# Patient Record
Sex: Female | Born: 1955 | Race: White | Hispanic: No | Marital: Married | Smoking: Never smoker
Health system: Southern US, Community
[De-identification: ages and names within clinical notes are randomized; demographics above are authoritative.]

## PROBLEM LIST (undated history)

## (undated) DIAGNOSIS — I10 Essential (primary) hypertension: Secondary | ICD-10-CM

## (undated) DIAGNOSIS — G4733 Obstructive sleep apnea (adult) (pediatric): Secondary | ICD-10-CM

## (undated) DIAGNOSIS — E7800 Pure hypercholesterolemia, unspecified: Secondary | ICD-10-CM

## (undated) DIAGNOSIS — D496 Neoplasm of unspecified behavior of brain: Secondary | ICD-10-CM

## (undated) DIAGNOSIS — G8929 Other chronic pain: Secondary | ICD-10-CM

## (undated) DIAGNOSIS — R51 Headache: Secondary | ICD-10-CM

## (undated) DIAGNOSIS — K5900 Constipation, unspecified: Secondary | ICD-10-CM

## (undated) DIAGNOSIS — C539 Malignant neoplasm of cervix uteri, unspecified: Secondary | ICD-10-CM

## (undated) DIAGNOSIS — J45909 Unspecified asthma, uncomplicated: Secondary | ICD-10-CM

## (undated) HISTORY — DX: Other chronic pain: G89.29

## (undated) HISTORY — PX: CHOLECYSTECTOMY: SHX55

## (undated) HISTORY — PX: VAGINAL HYSTERECTOMY: SUR661

## (undated) HISTORY — DX: Malignant neoplasm of cervix uteri, unspecified: C53.9

## (undated) HISTORY — PX: TUBAL LIGATION: SHX77

## (undated) HISTORY — DX: Obstructive sleep apnea (adult) (pediatric): G47.33

## (undated) HISTORY — DX: Unspecified asthma, uncomplicated: J45.909

## (undated) HISTORY — DX: Headache: R51

## (undated) HISTORY — PX: HEMORRHOID SURGERY: SHX153

## (undated) HISTORY — DX: Pure hypercholesterolemia, unspecified: E78.00

## (undated) HISTORY — DX: Essential (primary) hypertension: I10

## (undated) HISTORY — PX: OTHER SURGICAL HISTORY: SHX169

---

## 1998-07-11 ENCOUNTER — Ambulatory Visit: Admission: RE | Admit: 1998-07-11 | Discharge: 1998-07-11 | Payer: Self-pay | Admitting: Gynecology

## 1998-07-18 ENCOUNTER — Ambulatory Visit (HOSPITAL_COMMUNITY): Admission: RE | Admit: 1998-07-18 | Discharge: 1998-07-18 | Payer: Self-pay | Admitting: Gynecology

## 1998-08-01 ENCOUNTER — Ambulatory Visit: Admission: RE | Admit: 1998-08-01 | Discharge: 1998-08-01 | Payer: Self-pay | Admitting: Gynecology

## 1998-08-07 ENCOUNTER — Encounter: Payer: Self-pay | Admitting: Gynecology

## 1998-08-07 ENCOUNTER — Ambulatory Visit (HOSPITAL_COMMUNITY): Admission: RE | Admit: 1998-08-07 | Discharge: 1998-08-07 | Payer: Self-pay | Admitting: Gynecology

## 1998-08-14 ENCOUNTER — Ambulatory Visit: Admission: RE | Admit: 1998-08-14 | Discharge: 1998-08-14 | Payer: Self-pay | Admitting: Gynecology

## 1998-08-21 ENCOUNTER — Inpatient Hospital Stay (HOSPITAL_COMMUNITY): Admission: RE | Admit: 1998-08-21 | Discharge: 1998-08-24 | Payer: Self-pay | Admitting: Obstetrics & Gynecology

## 1998-09-18 ENCOUNTER — Ambulatory Visit: Admission: RE | Admit: 1998-09-18 | Discharge: 1998-09-18 | Payer: Self-pay | Admitting: Gynecologic Oncology

## 1999-04-15 ENCOUNTER — Other Ambulatory Visit: Admission: RE | Admit: 1999-04-15 | Discharge: 1999-04-15 | Payer: Self-pay | Admitting: *Deleted

## 1999-08-14 ENCOUNTER — Other Ambulatory Visit: Admission: RE | Admit: 1999-08-14 | Discharge: 1999-08-14 | Payer: Self-pay | Admitting: *Deleted

## 2000-01-04 ENCOUNTER — Emergency Department (HOSPITAL_COMMUNITY): Admission: EM | Admit: 2000-01-04 | Discharge: 2000-01-04 | Payer: Self-pay

## 2000-02-20 ENCOUNTER — Other Ambulatory Visit: Admission: RE | Admit: 2000-02-20 | Discharge: 2000-02-20 | Payer: Self-pay | Admitting: *Deleted

## 2000-04-20 ENCOUNTER — Emergency Department (HOSPITAL_COMMUNITY): Admission: EM | Admit: 2000-04-20 | Discharge: 2000-04-20 | Payer: Self-pay

## 2001-05-06 ENCOUNTER — Other Ambulatory Visit: Admission: RE | Admit: 2001-05-06 | Discharge: 2001-05-06 | Payer: Self-pay | Admitting: *Deleted

## 2001-08-09 ENCOUNTER — Encounter: Payer: Self-pay | Admitting: *Deleted

## 2001-08-11 ENCOUNTER — Encounter (INDEPENDENT_AMBULATORY_CARE_PROVIDER_SITE_OTHER): Payer: Self-pay | Admitting: Specialist

## 2001-08-12 ENCOUNTER — Inpatient Hospital Stay (HOSPITAL_COMMUNITY): Admission: EM | Admit: 2001-08-12 | Discharge: 2001-08-13 | Payer: Self-pay | Admitting: *Deleted

## 2002-04-21 ENCOUNTER — Other Ambulatory Visit: Admission: RE | Admit: 2002-04-21 | Discharge: 2002-04-21 | Payer: Self-pay | Admitting: *Deleted

## 2003-05-03 ENCOUNTER — Other Ambulatory Visit: Admission: RE | Admit: 2003-05-03 | Discharge: 2003-05-03 | Payer: Self-pay | Admitting: *Deleted

## 2004-03-08 ENCOUNTER — Ambulatory Visit (HOSPITAL_BASED_OUTPATIENT_CLINIC_OR_DEPARTMENT_OTHER): Admission: RE | Admit: 2004-03-08 | Discharge: 2004-03-08 | Payer: Self-pay | Admitting: Internal Medicine

## 2004-04-08 ENCOUNTER — Encounter: Admission: RE | Admit: 2004-04-08 | Discharge: 2004-04-08 | Payer: Self-pay | Admitting: Otolaryngology

## 2004-05-08 ENCOUNTER — Ambulatory Visit (HOSPITAL_COMMUNITY): Admission: RE | Admit: 2004-05-08 | Discharge: 2004-05-08 | Payer: Self-pay | Admitting: Orthopaedic Surgery

## 2004-12-31 IMAGING — CR DG CHEST 2V
2 series · 2 of 2 positions shown · non-contrast
Comparison: none

CLINICAL DATA: Left knee arthroscopy.  Preoperative respiratory exam. 
 CHEST - 2 VIEWS   
 The heart size and mediastinal contours are normal.  The lungs are clear.  The visualized skeleton is unremarkable. 
 IMPRESSION
 No active disease.

[view not recorded (1 of 2)]
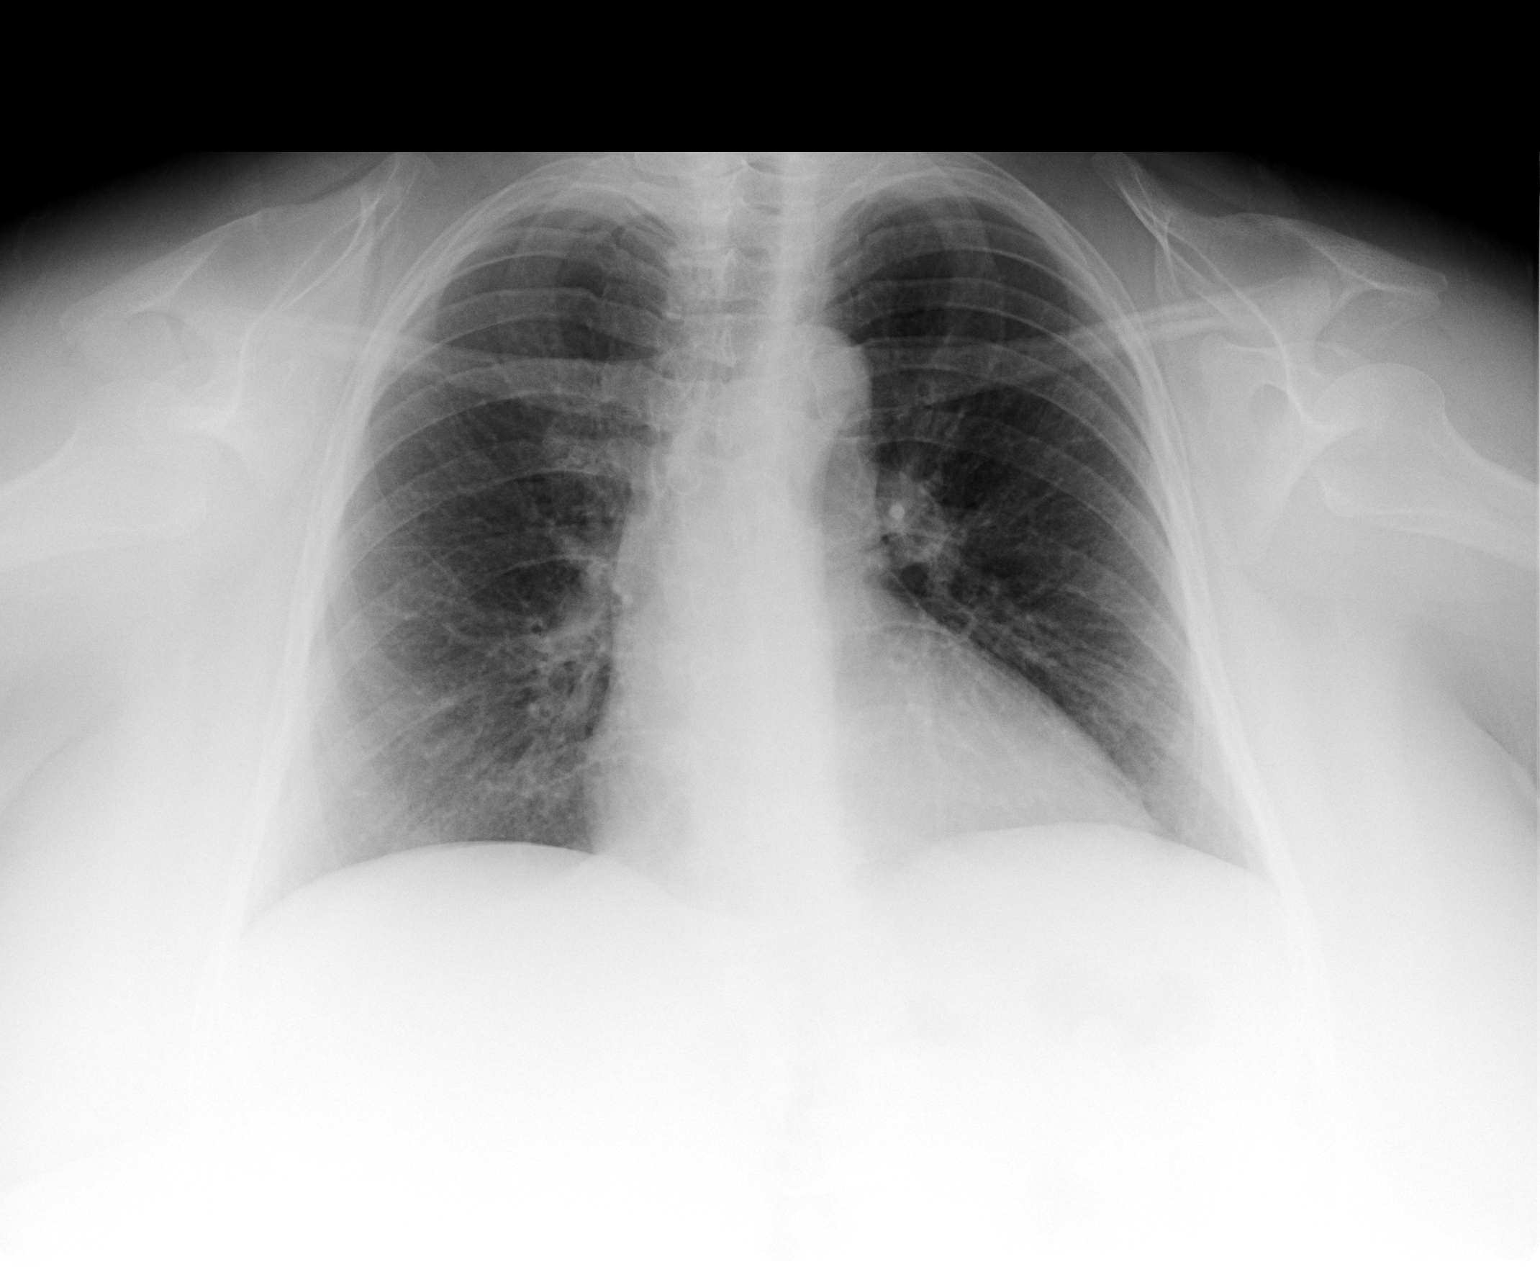

[view not recorded (2 of 2)]
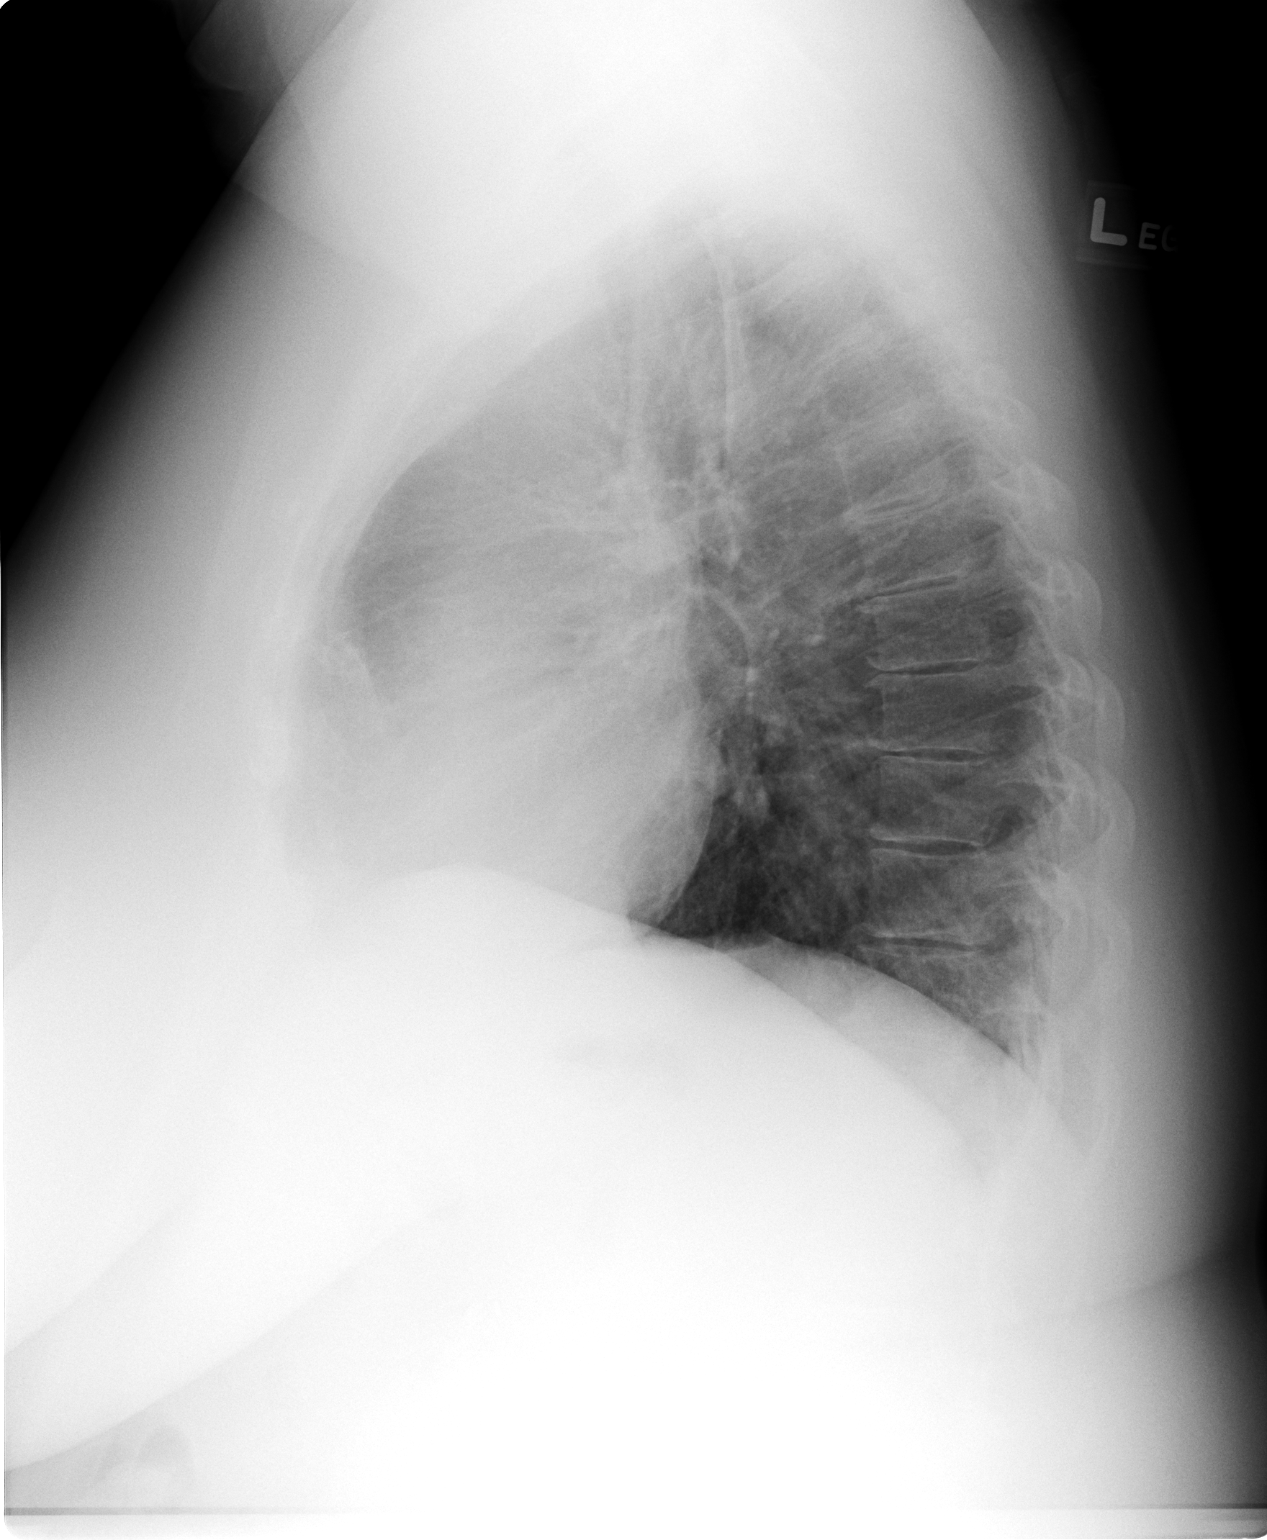

[2 of 2 positions shown; findings below may reference images not displayed]

## 2005-07-04 ENCOUNTER — Encounter: Admission: RE | Admit: 2005-07-04 | Discharge: 2005-07-04 | Payer: Self-pay | Admitting: Internal Medicine

## 2005-08-01 ENCOUNTER — Encounter: Admission: RE | Admit: 2005-08-01 | Discharge: 2005-08-01 | Payer: Self-pay | Admitting: Sports Medicine

## 2005-08-14 ENCOUNTER — Inpatient Hospital Stay (HOSPITAL_COMMUNITY): Admission: EM | Admit: 2005-08-14 | Discharge: 2005-08-16 | Payer: Self-pay | Admitting: Emergency Medicine

## 2005-09-08 ENCOUNTER — Other Ambulatory Visit: Admission: RE | Admit: 2005-09-08 | Discharge: 2005-09-08 | Payer: Self-pay | Admitting: *Deleted

## 2005-10-03 ENCOUNTER — Ambulatory Visit (HOSPITAL_COMMUNITY): Admission: RE | Admit: 2005-10-03 | Discharge: 2005-10-03 | Payer: Self-pay | Admitting: Gastroenterology

## 2006-02-18 ENCOUNTER — Encounter: Payer: Self-pay | Admitting: Gynecology

## 2006-07-31 ENCOUNTER — Ambulatory Visit (HOSPITAL_COMMUNITY): Admission: RE | Admit: 2006-07-31 | Discharge: 2006-07-31 | Payer: Self-pay | Admitting: Gastroenterology

## 2006-11-02 ENCOUNTER — Other Ambulatory Visit: Admission: RE | Admit: 2006-11-02 | Discharge: 2006-11-02 | Payer: Self-pay | Admitting: *Deleted

## 2007-11-17 ENCOUNTER — Other Ambulatory Visit: Admission: RE | Admit: 2007-11-17 | Discharge: 2007-11-17 | Payer: Self-pay | Admitting: *Deleted

## 2010-01-09 ENCOUNTER — Encounter: Admission: RE | Admit: 2010-01-09 | Discharge: 2010-01-09 | Payer: Self-pay | Admitting: Sports Medicine

## 2010-11-07 ENCOUNTER — Emergency Department (HOSPITAL_COMMUNITY)
Admission: EM | Admit: 2010-11-07 | Discharge: 2010-11-07 | Payer: Self-pay | Source: Home / Self Care | Admitting: Emergency Medicine

## 2010-11-11 LAB — COMPREHENSIVE METABOLIC PANEL
ALT: 18 U/L (ref 0–35)
AST: 20 U/L (ref 0–37)
Alkaline Phosphatase: 61 U/L (ref 39–117)
BUN: 26 mg/dL — ABNORMAL HIGH (ref 6–23)
CO2: 27 mEq/L (ref 19–32)
Chloride: 104 mEq/L (ref 96–112)
Creatinine, Ser: 1.11 mg/dL (ref 0.4–1.2)
Glucose, Bld: 107 mg/dL — ABNORMAL HIGH (ref 70–99)
Potassium: 3.7 mEq/L (ref 3.5–5.1)
Total Bilirubin: 0.7 mg/dL (ref 0.3–1.2)

## 2010-11-11 LAB — URINALYSIS, ROUTINE W REFLEX MICROSCOPIC
Ketones, ur: NEGATIVE mg/dL
Nitrite: NEGATIVE
Protein, ur: NEGATIVE mg/dL
Urobilinogen, UA: 0.2 mg/dL (ref 0.0–1.0)

## 2010-11-11 LAB — DIFFERENTIAL
Basophils Relative: 0 % (ref 0–1)
Eosinophils Absolute: 0.7 10*3/uL (ref 0.0–0.7)
Eosinophils Relative: 9 % — ABNORMAL HIGH (ref 0–5)
Lymphocytes Relative: 16 % (ref 12–46)
Monocytes Absolute: 0.4 10*3/uL (ref 0.1–1.0)
Monocytes Relative: 5 % (ref 3–12)
Neutrophils Relative %: 70 % (ref 43–77)

## 2010-11-11 LAB — URINE MICROSCOPIC-ADD ON

## 2010-11-11 LAB — CBC
Hemoglobin: 12.8 g/dL (ref 12.0–15.0)
RBC: 4.05 MIL/uL (ref 3.87–5.11)
WBC: 7.7 10*3/uL (ref 4.0–10.5)

## 2010-11-11 LAB — URINE CULTURE: Colony Count: NO GROWTH

## 2011-03-07 NOTE — H&P (Signed)
NAMEPHOUA, HOADLEY              ACCOUNT NO.:  [id number]   MEDICAL RECORD NO.:  [id number]          PATIENT TYPE:  EMS   LOCATION:  ED                           FACILITY:  [facility]   PHYSICIAN:  Hillery Aldo, M.D.   DATE OF BIRTH:  1956-05-06   DATE OF ADMISSION:  08/14/2005  DATE OF DISCHARGE:                                HISTORY & PHYSICAL   PRIMARY CARE PHYSICIAN:  Henri Medal, MD   CHIEF COMPLAINT:  Nausea and vomiting.   HISTORY OF PRESENT ILLNESS:  The patient is a 55 year old female who  presents with a 2-day history of cramping abdominal pain followed by nausea  and vomiting after eating lunch yesterday and breakfast today. She states  that the nausea and vomiting have gotten progressively worse and now she  cannot keep anything down. She reportedly moved her bowels earlier today  when she had a loose stools during an episode of vomiting. She states the  cramping pain is a 9/10 and at its worst. She is admitted for further  evaluation and workup after CT scanning of the abdomen demonstrated an  adynamic ileus.   PAST MEDICAL HISTORY:  1.  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  2.  Right salpingo-oophorectomy secondary to a hemorrhagic cyst.  3.  Exploratory laparotomy with lysis of adhesions.  4.  Cholecystectomy 1997.  5.  Hemorrhoidectomy 1975.  6.  History of endometriosis.  7.  History of bursitis  8.  History of obesity.  9.  Hypertension.  10. Osteoarthritis.   FAMILY HISTORY:  The patient's mother is deceased at age 52 secondary to  lung cancer. Her father is 63 and healthy. She has several half siblings who  are healthy. She also has two children who are healthy.   SOCIAL HISTORY:  The patient is married and lives with her husband in  [place]. 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 She denies any tobacco, alcohol, or  drug use.   ALLERGIES:  No known drug allergies.   MEDICATIONS:  1.  Diovan 160 mg daily.  2.  Lodine p.r.n.  3.  Singulair 10 mg daily.  4.  Zyrtec 10 mg daily.  5.  Nortriptyline at an unknown dose q.h.s. for pain.   REVIEW OF SYSTEMS:  The patient denies fever but then states she has had  intermittent chills and sweats. Unknown if she has had any weight changes.  Denies any shortness of breath or cough but states she hyperventilates when  she vomits. No chest pains or arrhythmias. No dysuria, polyuria or  frequency. She does have various musculoskeletal aches and pains secondary  to her arthritis and has chronic back pain. No headaches or other symptoms.   PHYSICAL EXAM:  VITAL SIGNS:  Temperature 97.8, pulse 63, respirations 20,  blood pressure 119/78, O2 saturation 96% on room air.  GENERAL:  Well-developed, well-nourished obese female who appears  comfortable.  HEENT:  Normocephalic, atraumatic. PERRL. EOMI. Oropharynx is clear with  moist mucous  membranes. She does have some angular cheilitis.  NECK:  Supple, no thyromegaly, no lymphadenopathy, no jugular venous  distension.  CHEST:  Lungs clear to auscultation bilaterally with good air movement.  HEART:  Regular rate, rhythm. No murmurs, rubs or gallops.  ABDOMEN:  Soft, slightly tender to palpation diffusely. Decreased bowel  sounds.  RECTAL:  Deferred secondary to patient being in the hallway in a nonprivate  area for exam.  EXTREMITIES:  No clubbing, edema, or cyanosis. Skin is warm and dry. No  rashes.  NEUROLOGIC:  Alert and oriented x3. Nonfocal.   CT scanning of the abdomen showed an adynamic ileus with hiatal hernia.  There are also umbilical and ventral pelvic hernias containing fat.   LABORATORY DATA:  Laboratory data revealed a white blood cell count of 10.3,  hemoglobin 15.4, hematocrit 44.2, platelets 295. Absolute neutrophil count  was 9.4. Sodium is 139, potassium 3.5, chloride 106, bicarb 22, BUN 25,   creatinine 0.8, glucose 104. LFTs were within normal limits except for  albumin which was low at 3.4. Lipase 24. Urinalysis was clear.   ASSESSMENT/PLAN:  1.  Nausea and vomiting secondary to adynamic ileus:  Will admit the patient      for observation and bowel rest. Will administer IV fluids at 100 mL an      hour with potassium supplementation. Will administer antiemetics p.r.n.      for nausea and vomiting. Will check a TSH to rule out hypothyroidism as      a cause. Currently her electrolytes do not appear to be the cause of      this adynamic ileus, but will monitor them. Will start her on low-dose      Reglan q.6 h. Will check a KUB in the morning.  2.  Hypertension:  Will hold the patient's Diovan until she is able to take      p.o.'s.  3.  Osteoarthritis:  Will use Toradol p.r.n. IV for musculoskeletal pain.  4.  Prophylaxis:  Will initiate DVT and GI prophylaxis.           ______________________________  Hillery Aldo, M.D.     CR/MEDQ  D:  08/14/2005  T:  08/14/2005  Job:  [id number]   cc:   Henri Medal, MD  Fax: [phone]

## 2011-03-07 NOTE — H&P (Signed)
[facility]  Patient:    Cassandra Kelley, Cassandra Kelley Visit Number: [id number] MRN: [id number]          Service Type: Attending:  Almedia Balls. Randell Patient, M.D. Dictated by:   Almedia Balls Randell Patient, M.D. Adm. Date:  08/11/01                           History and Physical  CHIEF COMPLAINT:  Pain, pelvic cyst.  HISTORY OF PRESENT ILLNESS:  The patient is a 55 year old, status post abdominal hysterectomy, left salpingo-oophorectomy for adenocarcinoma in situ and ovarian cyst in November 1999.  She has done well until the last several months when she began having pain in her right lower quadrant.  Ultrasound and CT scans were done, which showed a right ovarian cystic mass.  This was approximately 11 cm in greatest dimensions and quite tender.  It appeared to be suspicious for endometriosis.  She is admitted at this time for exploratory laparotomy, right salpingo-oophorectomy, and indicated procedures because of the problem.  She has been fully counseled as to the nature of the procedure and the risks involved to include risks of anesthesia, injury to bowel, bladder, blood vessels, ureters, postoperative hemorrhage, infection, recuperation.  She fully understands all these considerations and wishes to proceed on August 11, 2001.  Pap smear was normal in July 2002.  PAST MEDICAL HISTORY:  Includes the above-noted surgery.  She has also had: 1. Cholecystectomy in 1997. 2. Hemorrhoidectomy in 1975.  MEDICATIONS: 1. Propranolol LA 120 mg. 2. Prevacid 30 mg daily  ALLERGIES:  She is allergic to no medications.  FAMILY HISTORY:  Maternal aunt and maternal cousin with breast cancer and cervical cancer.  REVIEW OF SYSTEMS:  HEENT:  Occasional headaches which have not been too bad. CARDIORESPIRATORY:  Hypertension, maintained on propranolol, with good response.  GASTROINTESTINAL:  GERD and acid problems, for which she takes Prevacid.  GENITOURINARY:  As in present illness.   NEUROMUSCULAR:  Negative.  PHYSICAL EXAMINATION:  VITAL SIGNS:  Height 5 feet 1 inch, weight 289 pounds.  Blood pressure 142/84, respirations 18.  GENERAL:  Well-developed white female in no acute distress.  HEENT:  Within normal limits.  NECK:  Supple.  Without masses, adenopathy, or bruits.  HEART:  Regular rate and rhythm.  Without murmurs.  LUNGS:  Clear to P&A.  BREASTS:  Examined sitting and lying, without mass.  Axilla negative.  ABDOMEN:  Soft but tender in the right lower quadrant.  PELVIC:  External genitalia, Bartholin, urethral, and Skenes glands within normal limits.  Cervix, left tube and ovary were absent.  Fullness and tenderness in the right adnexal area.  Anterior and posterior cul-de-sac exam is confirmatory.  EXTREMITIES:  Within normal limits.  NEUROLOGIC:  Central nervous system grossly intact.  SKIN:  Without suspicious lesions.  IMPRESSION:  Right ovarian cyst with pain.  History of endometriosis.  PLAN:  As noted above. Dictated by:   Almedia Balls Randell Patient, M.D. Attending:  Almedia Balls. Randell Patient, M.D. DD:  08/09/01 TD:  08/10/01 Job: [id number]

## 2011-03-07 NOTE — Discharge Summary (Signed)
Cassandra Kelley, Cassandra Kelley              ACCOUNT NO.:  [id number]   MEDICAL RECORD NO.:  [id number]          PATIENT TYPE:  INP   LOCATION:  1607                         FACILITY:  [facility]   PHYSICIAN:  Hillery Aldo, M.D.   DATE OF BIRTH:  23-Oct-1955   DATE OF ADMISSION:  08/14/2005  DATE OF DISCHARGE:  08/16/2005                                 DISCHARGE SUMMARY   PRIMARY CARE PHYSICIAN:  Ralene Ok, M.D.   DISCHARGE DIAGNOSES:  1.  Adynamic ileus.  2.  Nausea and vomiting secondary to #1.  3.  Hypokalemia.  4.  Obesity.  5.  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  6.  Hypertension.  7.  Osteoarthritis.  8.  Chronic constipation.  9.  Ventral hernia with fat extending into hernial sac.   DISCHARGE MEDICATIONS:  1.  Diovan 160 mg daily.  2.  Singulair 10 mg daily.  3.  Zyrtec 10 mg daily.  4.  Lodine p.r.n.  5.  Reglan 5 mg 1/2 hour prior to breakfast and supper (can decrease to 1      time a day if bowels remain regular).   Note, the patient was instructed to discontinue nortriptyline secondary to  anticholinergic effect likely contributing to chronic constipation.   PROCEDURES AND DIAGNOSTIC STUDIES:  1.  CT scan of the abdomen and pelvis on August 14, 2005 showed mild      nonspecific ileus. She had ventral hernias in the umbilical and pelvic      areas that contained fat.  2.  KUB on August 15, 2005 showed nonspecific, nonobstructive bowel gas      pattern.   DISCHARGE LABORATORY VALUES:  Fecal leukocytes were negative, white blood  cell count was 3.3, hemoglobin 13.6, hematocrit 38.2, and platelets 228.  Sodium was 139, potassium 3.4, chloride 111, bicarb 20, BUN 13, creatinine  0.7, glucose 93, calcium 7.9. TSH was 0.665. Lipase was 24.   BRIEF HISTORY AND PHYSICAL:  The patient is a 55 year old female who came  into the emergency department with a 2 day history of cramping, abdominal  pain with nausea and vomiting. Initial workup revealed  an adynamic ileus.  She was admitted for conservative therapy including bowel rest and further  diagnostic workup.   HOSPITAL COURSE BY PROBLEM:  #1.  NAUSEA AND VOMITING SECONDARY TO ADYNAMIC  ILEUS:  The patient was admitted and kept n.p.o.  She was given antiemetics  p.r.n. She was put on Reglan 5 mg q.6 h initially and by hospital day #2,  she was feeling dramatically better. Her diet was advanced from clear  liquids to regular as tolerated. She tolerated this well. She did develop  some diarrheal stools and will be discharged on a lower dose of Reglan. Her  thyroid function was found to be normal. Her initial electrolyte evaluation  was also normal. It is likely that the patient does have some underlying  constipation predominance, irritable bowel syndrome or is sensitive to the  anticholinergic effects of nortriptyline which she was taking prior to  admission. We have held this medication and have instructed her to continue  to hold it until she sees her primary care physician in followup.  #2.  HYPOKALEMIA:  Likely secondary to GI losses. She was repleted prior to  discharge.  #3.  DIARRHEA:  The patient did develop diarrheal stools. They were sent for  fecal leukocytes and C difficile toxin testing. Fecal leukocytes were  negative. C difficile toxin is pending but likely negative given her normal  white blood cell count and resolution of the diarrhea with Pepto Bismol.  #4.  HYPERTENSION:  The patient was restarted on her home medication of  Avapro once she was able to tolerate p.o. Her blood pressure was well  controlled during her hospitalization once this medication was resumed.  #5.  OSTEOARTHRITIS:  The patient was given Tylenol as needed. We avoided  narcotics given her chronic constipation. She may benefit from Celebrex or  alternative medications that do not cause constipation.  #6.  OBESITY:  The patient was given instruction with regard to diet and  exercise for weight  loss.   DISCHARGE INSTRUCTIONS:  The patient was instructed to increase activity  slowly. She was also instructed on maintaining a high fiber, low fat diet.  She is to call her primary care physician for any return of vomiting or  constipation. She should followup with Dr. Ludwig Clarks in 1-2 weeks.   CONDITION ON DISCHARGE:  Improved.           ______________________________  Hillery Aldo, M.D.     CR/MEDQ  D:  08/16/2005  T:  08/16/2005  Job:  [id number]   cc:   Ralene Ok, M.D.  Fax: [phone]

## 2011-03-07 NOTE — Op Note (Signed)
[facility]  Patient:    Cassandra Kelley, Cassandra Kelley Visit Number: [id number] MRN: [id number]          Service Type: SUR Location: 4W [id number] 01 Attending Physician:  Collene Schlichter Proc. Date: 08/11/01 Admit Date:  08/11/2001   CC:         Harl Bowie, M.D.   Operative Report  PREOPERATIVE DIAGNOSES: 1. Pelvic pain. 2. Ovarian cyst. 3. Status post hysterectomy and left salpingo-oophorectomy.  POSTOPERATIVE DIAGNOSES: 1. Probable endometriosis with endometrioma. 2. Pelvic adhesions.  OPERATION:  Exploratory laparotomy, right salpingo-oophorectomy, lysis of adhesions.  ANESTHESIA:  General orotracheal.  OPERATOR:  Almedia Balls. Randell Patient, M.D.  FIRST ASSISTANT:  Harl Bowie, M.D.  INDICATION FOR SURGERY:  The patient is a 55 year old, status post abdominal hysterectomy and left salpingo-oophorectomy for adenocarcinoma in situ of the cervix and findings of probable endometriosis in 1999.  She has been experiencing increasing pain and found to have a cyst of the right ovary which was of moderate size and quite tender.  She is admitted at this time for right salpingo-oophorectomy.  She has been fully counseled as to the nature of the procedure and the risks involved to include risks of anesthesia, injury to bowel, bladder, blood vessels, ureters, postoperative hemorrhage, infection, recuperation, and use of hormone replacement should her ovary be removed.  She fully understands all of these considerations and wishes to proceed on August 11, 2001.  OPERATIVE FINDINGS:  On entry into the abdominal cavity, there were noted to be adhesions involving the omentum to the anterior peritoneal surface. Exploration of the upper abdomen revealed that the liver, diaphragm, spleen, kidneys, periaortic areas were normal to palpation.  The uterus, left tube, and ovary had been previously surgically excised.  There were adhesions involving the right tube and  ovary to the lateral peritoneal surface and to the peritoneum medially as well.  The ovary itself was enlarged and contained a chocolate material thought to be endometriosis.  DESCRIPTION OF PROCEDURE:  With the patient under general anesthesia, prepared and draped in the usual sterile fashion, with a Foley catheter in the bladder, a lower abdominal vertical incision was made after excising a previous surgical scar.  This was carried into the peritoneal cavity without difficulty.  The adhesions involving the omentum and loops of bowel to the anterior peritoneal surface were lysed using sharp dissection.  It was then possible to place a self-retaining retractor.  The bowel was packed off, and the ovary was grasped using a Babcock clamp.  Elevation of this area allowed for lysis of adhesions in the areas noted above.  The retroperitoneal space was then opened with identification of the ureter well below the plane of dissection.  The infundibulopelvic ligament was clamped, cut, and doubly ligated with 1 chromic catgut.  Small bleeder was likewise clamped and suture ligated with 1 chromic catgut.  Small bleeders were rendered hemostatic using Bovie electrocoagulation, and with dissection well above the course of the ureter and vessels, the right tube and ovary were excised using Bovie electrocoagulation along the peritoneal edge.  Several small bleeders were rendered hemostatic with Bovie electrocoagulation.  The area was then lavaged with copious amounts of Lactated Ringers solution, and after noting that hemostasis was maintained, except for a slight ooze in the bed of the ovary which was treated using a small piece of Gelfoam hemostatic material, and after noting that sponge and instrument counts were correct, the peritoneum was closed with a continuous suture of 0 Vicryl.  The fascia was closed with two sutures of 0 PDS which were brought from the upper and lower aspects of the incision  and tied separately in the midline.  Subcutaneous fat was reapproximated with interrupted sutures of 1 chromic catgut.  Skin was closed with skin clips.  Estimated blood loss 100 mL.  The patient was taken to the recovery room in good condition with clear urine and a Foley catheter tubing. She will be placed on 23-hour observation following surgery. Attending Physician:  Collene Schlichter DD:  08/11/01 TD:  08/12/01 Job: [id number]

## 2011-05-14 ENCOUNTER — Other Ambulatory Visit: Payer: Self-pay | Admitting: Gynecology

## 2011-05-14 DIAGNOSIS — N63 Unspecified lump in unspecified breast: Secondary | ICD-10-CM

## 2011-05-20 ENCOUNTER — Ambulatory Visit
Admission: RE | Admit: 2011-05-20 | Discharge: 2011-05-20 | Disposition: A | Discharge status: Home / Self Care | Payer: 59 | Source: Ambulatory Visit | Attending: Gynecology | Admitting: Gynecology

## 2011-05-20 DIAGNOSIS — N63 Unspecified lump in unspecified breast: Secondary | ICD-10-CM

## 2011-05-22 ENCOUNTER — Other Ambulatory Visit: Payer: Self-pay | Admitting: Gynecology

## 2011-05-22 DIAGNOSIS — N6310 Unspecified lump in the right breast, unspecified quadrant: Secondary | ICD-10-CM

## 2011-05-29 ENCOUNTER — Ambulatory Visit
Admission: RE | Admit: 2011-05-29 | Discharge: 2011-05-29 | Disposition: A | Discharge status: Home / Self Care | Payer: 59 | Source: Ambulatory Visit | Attending: Gynecology | Admitting: Gynecology

## 2011-05-29 DIAGNOSIS — N6310 Unspecified lump in the right breast, unspecified quadrant: Secondary | ICD-10-CM

## 2012-01-21 IMAGING — US US CORE BIOPSY
1 series · 9 of 9 positions shown · non-contrast
Comparison: none

***ADDENDUM*** CREATED: 05/30/2011 [DATE]

Pathology revealed benign breast parenchyma with fat necrosis,
fibrosis, hemosiderin deposition and mixed inflammation with
multinucleated foreign body giant cells in the right breast. This
was found to be concordant by Dr. Quirijn Amazigh. Pathology was
relayed by telephone. The patient reported doing well after the
biopsy. Post biopsy instructions were reviewed and her questions
were answered. She was encouraged to call The [REDACTED] for any additional concerns. She was asked to
return in 1 year for screening mammography.
Pathology results are dictated by Angi Billiot RN, BSN on [facility]
CLINICAL DATA: Right breast mass 1 o'clock location.  The patient
requests biopsy.

[Series 2: us core biopsy · 9 of 9 slices shown]
[im 1/9]
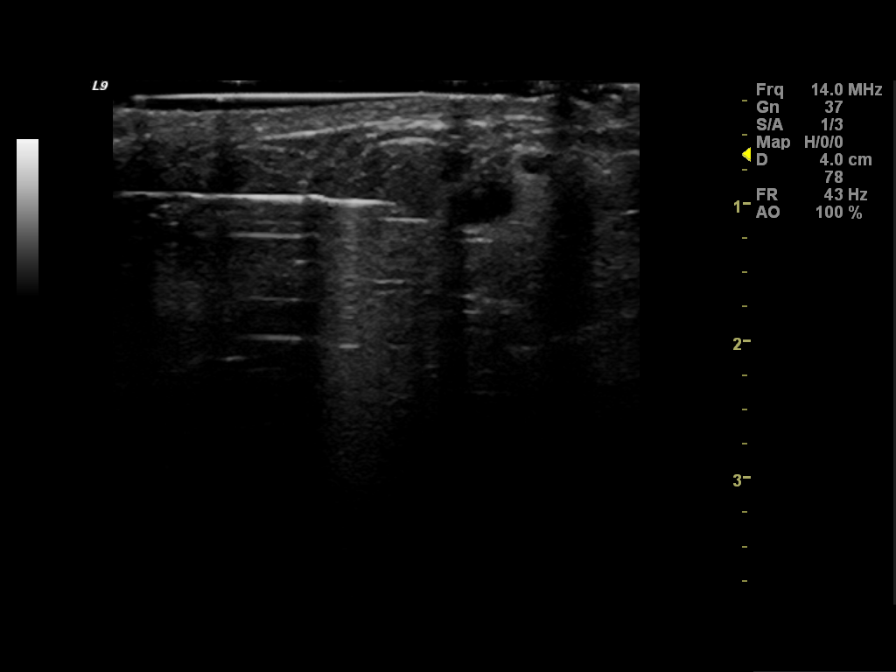
[im 2/9]
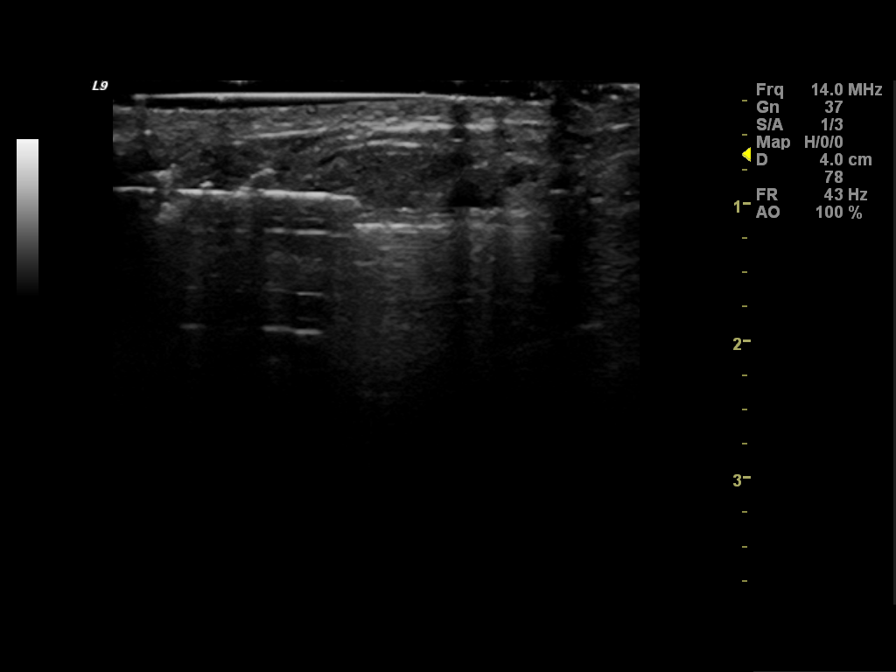
[im 3/9]
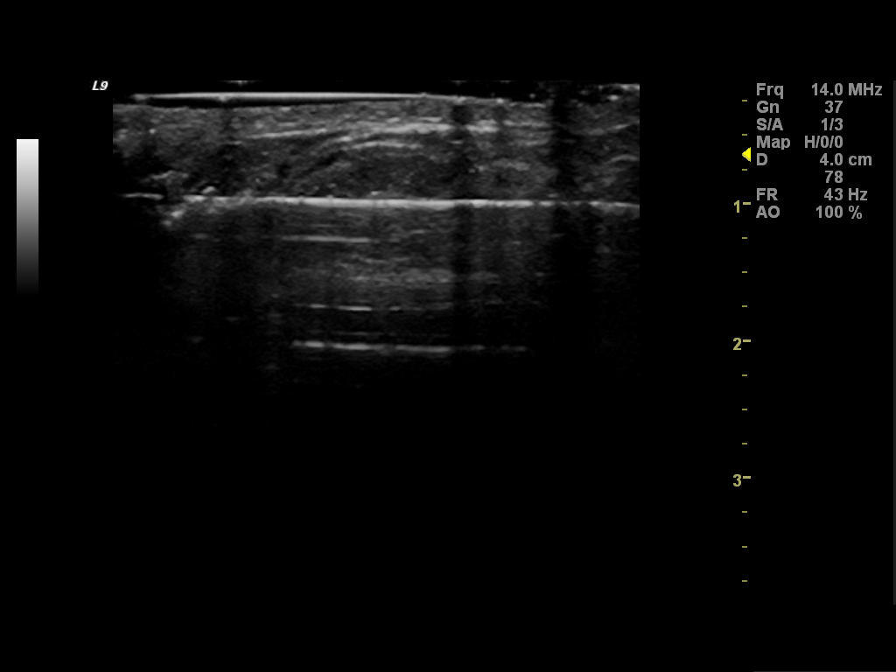
[im 4/9]
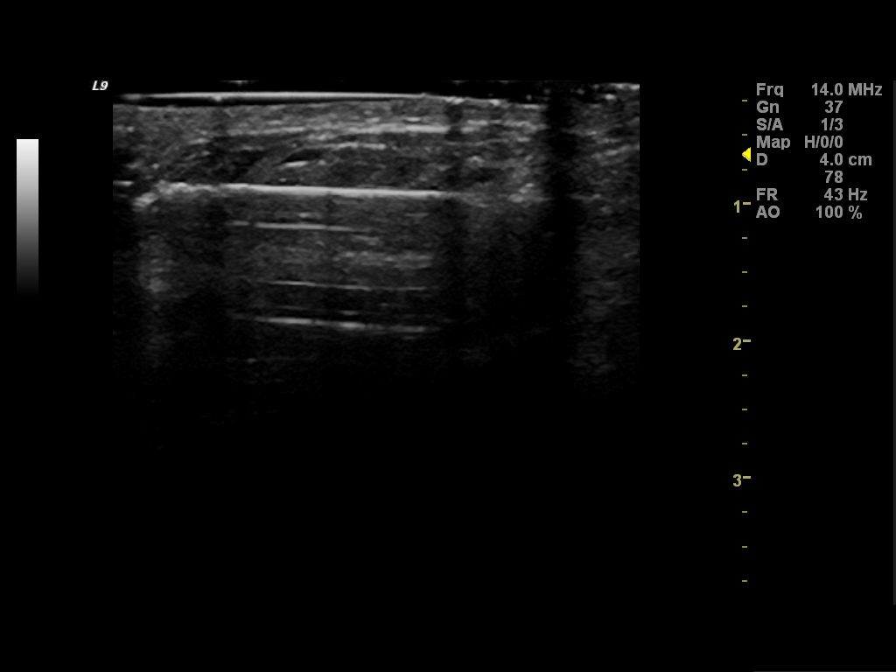
[im 5/9]
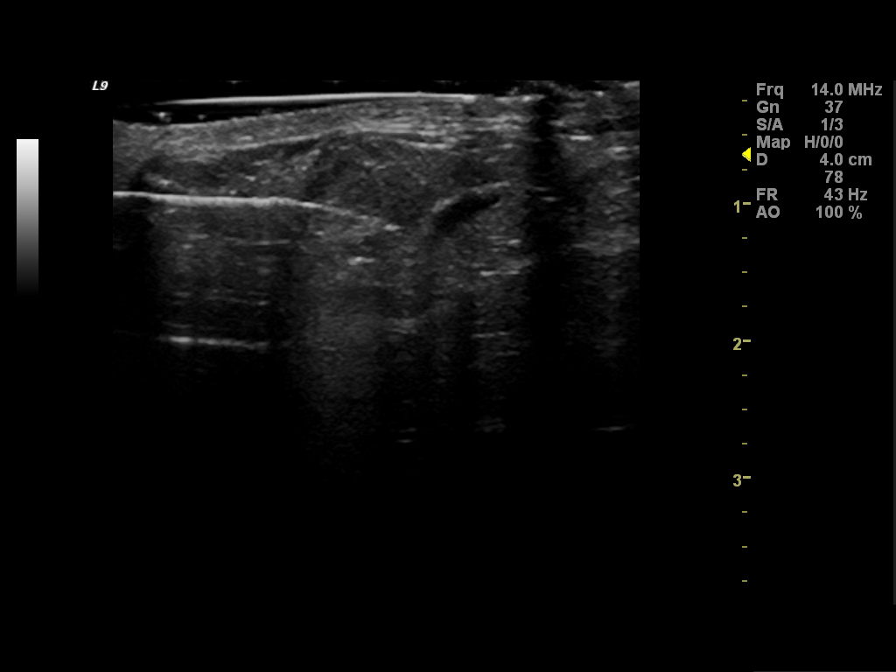
[im 6/9]
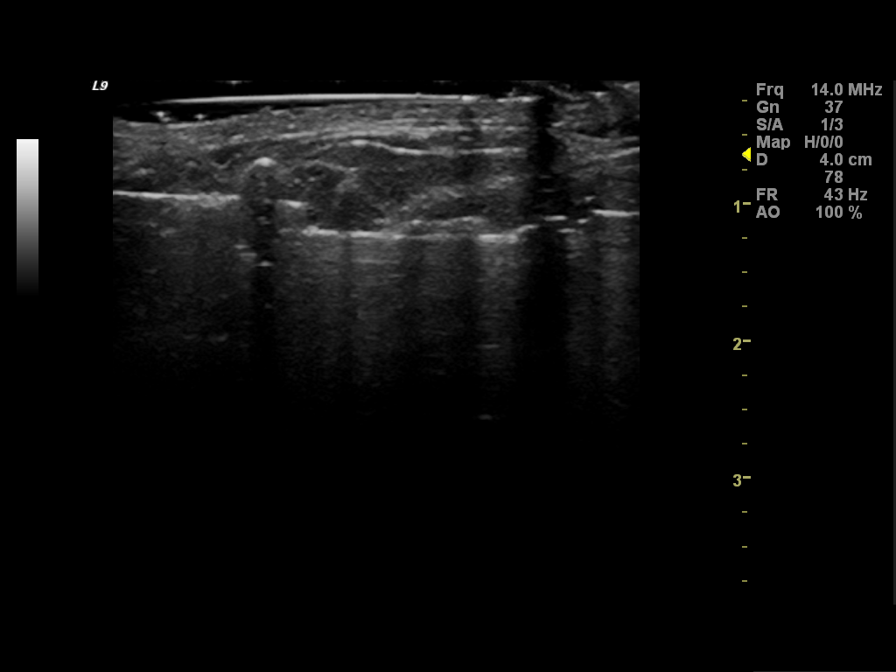
[im 7/9]
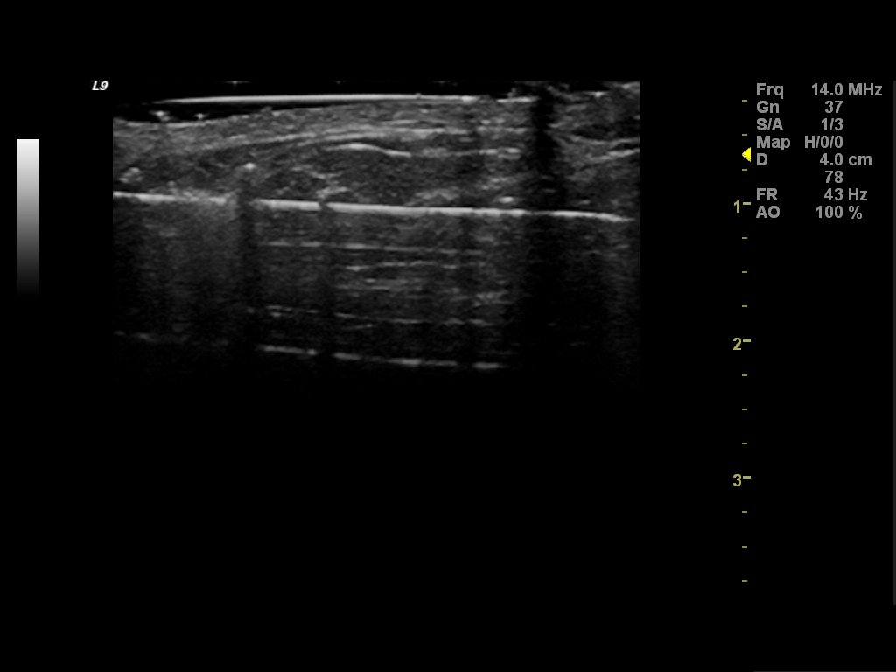
[im 8/9]
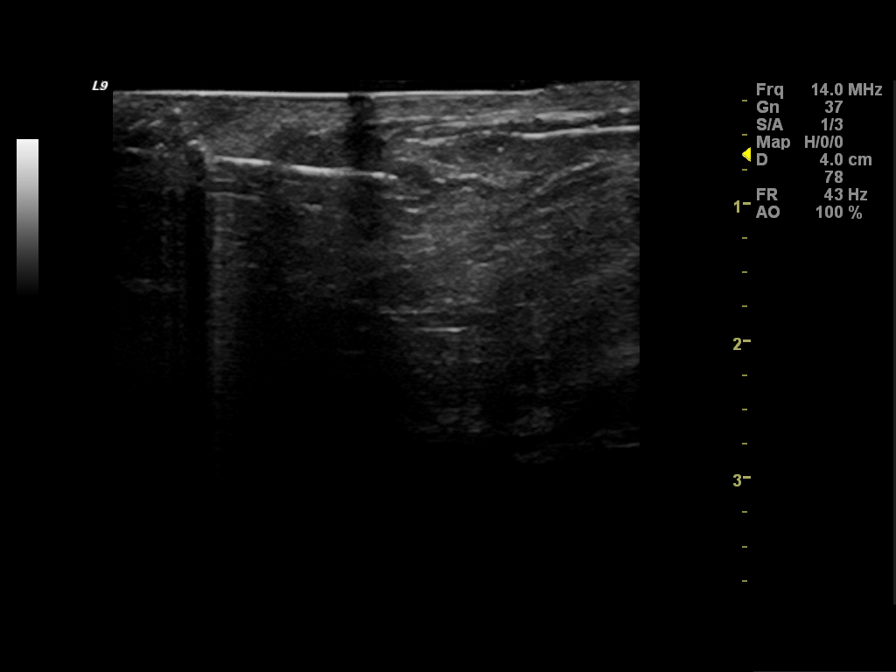
[im 9/9]
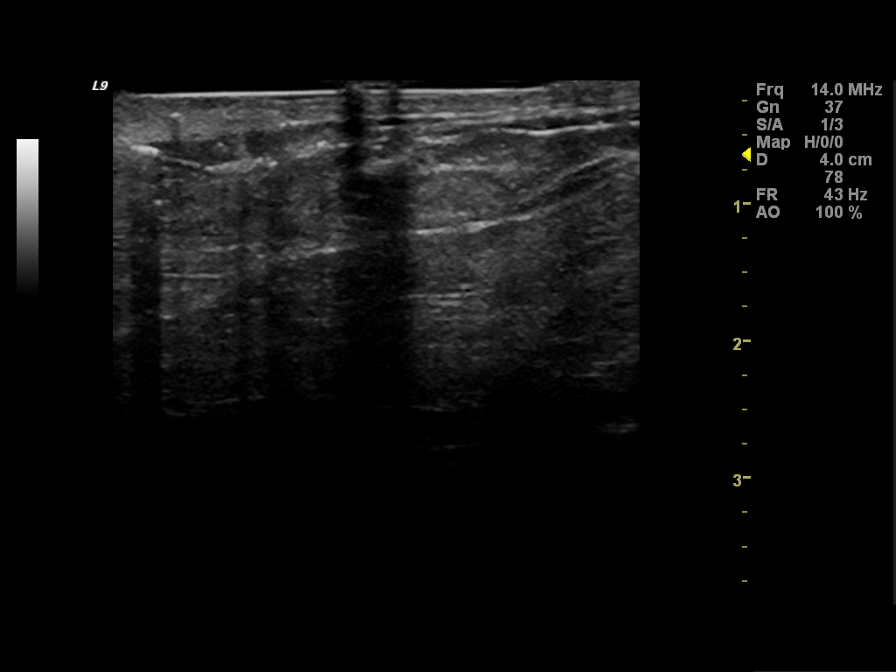

[9 of 9 positions shown; findings below may reference images not displayed]

ULTRASOUND GUIDED VACUUM ASSISTED CORE BIOPSY OF THE RIGHT BREAST

I met with the patient, and we discussed the procedure of
ultrasound-guided biopsy, including risks, benefits, and
alternatives.  Specifically, we discussed the risks of infection,
bleeding, tissue injury, clip migration, and inadequate sampling.
Informed, written consent was given.

Using sterile technique, 2% lidocaine, ultrasound guidance, and a
12 gauge vacuum assisted needle, biopsy was performed of the right
breast mass in the 1 o'clock location using a lateral to medial
approach.  After two passes, the cystic component was entirely
decompressed and there is no residual visualized mass.  At the
conclusion of the procedure, a tissue marker clip was deployed into
the biopsy cavity.  Follow-up 2-view mammogram was performed and
dictated separately.
IMPRESSION: Ultrasound-guided biopsy of right breast mass 1 o'clock location,
with clip placement.  Apparent collapse of the cystic portion of
the lesion during the procedure was noted.  No apparent
complications.

## 2012-02-10 ENCOUNTER — Ambulatory Visit (HOSPITAL_BASED_OUTPATIENT_CLINIC_OR_DEPARTMENT_OTHER): Payer: 59 | Attending: Cardiology | Admitting: Radiology

## 2012-02-10 DIAGNOSIS — G4733 Obstructive sleep apnea (adult) (pediatric): Secondary | ICD-10-CM | POA: Insufficient documentation

## 2012-02-14 DIAGNOSIS — G4733 Obstructive sleep apnea (adult) (pediatric): Secondary | ICD-10-CM

## 2012-02-14 NOTE — Procedures (Signed)
NAMETRUST, CRAGO              ACCOUNT NO.:  [id number]  MEDICAL RECORD NO.:  [id number]          PATIENT TYPE:  OUT  LOCATION:  SLEEP CENTER                 FACILITY:  [facility]  PHYSICIAN:  Navreet Bolda D. Maple Hudson, MD, FCCP, FACPDATE OF BIRTH:  05[birth year]04[birth year]  DATE OF STUDY:  02/10/2012                           NOCTURNAL POLYSOMNOGRAM  REFERRING PHYSICIAN:  Pamella Pert, MD  REFERRING PHYSICIAN:  Pamella Pert, MD  INDICATION FOR STUDY:  Hypersomnia with insomnia with sleep apnea.  EPWORTH SLEEPINESS SCORE:  16/24.  BMI 45.4, weight 225 pounds, height 59 inches, neck 14 inches.  HOME MEDICATIONS:  Charted and reviewed.  SLEEP ARCHITECTURE:  Total sleep time 306.5 minutes with sleep efficiency 69.8%.  Stage I was 5.5%.  Stage II 67.2%.  Stage III 13.4%, REM 13.9% of total sleep time.  Sleep latency 72 minutes, REM latency 98.5 minutes, awake after sleep onset 60 minutes, arousal index 28.  BEDTIME MEDICATION:  Oxycodone.  RESPIRATORY DATA:  Apnea/hypopnea index (AHI) 7.2 per hour.  A total of 37 events were scored including 19 obstructive apneas, 2 central apneas, 1 mixed apnea, 15 hypopneas.  Most events were associated with nonsupine sleep position and REM.  REM AHI 16.9 per hour.  There were insufficient numbers of respiratory events to permit application of split protocol, CPAP titration on this study night.  OXYGEN DATA:  Moderate snoring with oxygen desaturation to a nadir of 90% and mean oxygen saturation through the study of 94.8% on room air.  CARDIAC DATA:  Normal sinus rhythm.  MOVEMENT/PARASOMNIA:  No significant movement disturbance.  Bathroom x1.  IMPRESSION/RECOMMENDATION: 1. Mild obstructive sleep apnea/hypopnea syndrome, AHI 7.2 per hour     with mainly nonsupine events.  Moderate snoring with oxygen     desaturation to a nadir of 90% and mean oxygen saturation through     the study of 94.8% on room air. 2. There were insufficient numbers of early  events to permit     application of split protocol, CPAP titration on this study night.     Keyara Ent D. Maple Hudson, MD, [facility], FACP Diplomate, American Board of Sleep Medicine    CDY/MEDQ  D:  02/14/2012 12:49:01  T:  02/14/2012 13:37:24  Job:  [id number]

## 2012-04-05 ENCOUNTER — Encounter: Payer: Self-pay | Admitting: Pulmonary Disease

## 2012-04-05 ENCOUNTER — Ambulatory Visit (INDEPENDENT_AMBULATORY_CARE_PROVIDER_SITE_OTHER): Payer: 59 | Admitting: Pulmonary Disease

## 2012-04-05 VITALS — BP 120/84 | HR 52 | Temp 98.6°F | Ht 59.0 in | Wt 225.8 lb

## 2012-04-05 DIAGNOSIS — G4733 Obstructive sleep apnea (adult) (pediatric): Secondary | ICD-10-CM

## 2012-04-05 NOTE — Progress Notes (Deleted)
  Subjective:    Patient ID: Cassandra Kelley, female    DOB: 05-24-1956, 56 y.o.   MRN: [id number]  HPI    Review of Systems  Constitutional: Positive for unexpected weight change. Negative for fever and appetite change.  HENT: Negative for ear pain, congestion, sore throat, sneezing, trouble swallowing and dental problem.   Respiratory: Positive for shortness of breath. Negative for cough.   Cardiovascular: Negative for chest pain, palpitations and leg swelling.  Gastrointestinal: Negative for abdominal pain.  Musculoskeletal: Positive for arthralgias. Negative for joint swelling.  Skin: Negative for rash.  Neurological: Positive for headaches.  Psychiatric/Behavioral: Negative for dysphoric mood. The patient is not nervous/anxious.        Objective:   Physical Exam        Assessment & Plan:

## 2012-04-05 NOTE — Progress Notes (Signed)
Chief Complaint  Patient presents with  . Advice Only    Pt had sleep study 02/10/12--here to discuss results    History of Present Illness: Cassandra Kelley is a 56 y.o. female for evaluation of sleep apnea.  She was noted by her PCP to have low heart rate.  She was referred to Dr. Jacinto Halim for further assessment of this.  During this evaluation there was concern for sleep disordered breathing.  As a result she had sleep study on February 10, 2012.  This study showed mild sleep apnea with an AHI of 7.2, but her REM AHI was 16.9.  As a result she was referred to pulmonary/sleep for further assessment.  She is retired.  She goes to bed at 330 am.  It can take up to an hour for her to fall asleep.  She has trouble finding a comfortable position due to back pain.  She will take a pain pill to help, but does not use anything else to help sleep.  She wakes up a couple of times during the night to use the bathroom.  She gets out of bed at 1030 am.  She feels tired during the day.  She used to take provigil to help keep her awake.  This helped, but she couldn't afford the medicine anymore.  She has lost almost 100 lbs in the past 1.5 years.  She attributes this to lack of appetite related to dealing with her back pain.  She has noticed some improvement in her sleep quality with weight loss.  She still snores, and will wake up with a choking sensation.  She tends to have frequent dreams.  Her Epworth score is 16 out of 24.  She denies sleep walking, sleep talking, bruxism, or nightmares.  There is no history of restless legs.  The she denies sleep hallucinations, sleep paralysis, or cataplexy.   Past Medical History  Diagnosis Date  . Hypertension   . Asthma   . High cholesterol   . Cervical cancer   . Chronic headaches   . OSA (obstructive sleep apnea)     Past Surgical History  Procedure Date  . Cholecystectomy   . Vaginal hysterectomy   . Tubal ligation   . Hemrr   . Hemorrhoid surgery      Current Outpatient Prescriptions on File Prior to Visit  Medication Sig Dispense Refill  . Calcium Carb-Cholecalciferol (CALCIUM PLUS VITAMIN D3 PO) 1 tablet twice a day      . cetirizine (KLS ALLER-TEC) 10 MG tablet Take 10 mg by mouth daily.      . famotidine (PEPCID) 20 MG tablet Take 20 mg by mouth daily.      . hydrochlorothiazide (MICROZIDE) 12.5 MG capsule Take 12.5 mg by mouth daily.      . pravastatin (PRAVACHOL) 40 MG tablet Take 40 mg by mouth daily.      . valsartan (DIOVAN) 160 MG tablet Take 160 mg by mouth daily.        No Known Allergies  Family History  Problem Relation Age of Onset  . Lung cancer Mother   . Cancer Maternal Aunt   . Heart disease Maternal Grandfather   . Heart disease Maternal Grandmother   . Heart disease Paternal Grandfather   . Heart disease Paternal Grandmother   . Asthma Son   . Asthma Father   . Asthma Maternal Uncle   . Allergies      everyone    History  Substance Use Topics  .  Smoking status: Never Smoker   . Smokeless tobacco: Not on file  . Alcohol Use: No   Review of Systems  Constitutional: Positive for unexpected weight change. Negative for fever and appetite change.  HENT: Negative for ear pain, congestion, sore throat, sneezing, trouble swallowing and dental problem.   Respiratory: Positive for shortness of breath. Negative for cough.   Cardiovascular: Negative for chest pain, palpitations and leg swelling.  Gastrointestinal: Negative for abdominal pain.  Musculoskeletal: Positive for arthralgias. Negative for joint swelling.  Skin: Negative for rash.  Neurological: Positive for headaches.  Psychiatric/Behavioral: Negative for dysphoric mood. The patient is not nervous/anxious.     Physical Exam: Filed Vitals:   04/05/12 1417  BP: 120/84  Pulse: 52  Temp: 98.6 F (37 C)  TempSrc: Oral  Height: 4\' 11"  (1.499 m)  Weight: 225 lb 12.8 oz (102.422 kg)  SpO2: 99%  ,  Current Encounter SPO2  04/05/12 1417 99%    Wt Readings from Last 3 Encounters:  04/05/12 225 lb 12.8 oz (102.422 kg)  02/10/12 225 lb (102.059 kg)   Body mass index is 45.61 kg/(m^2).  General - No distress ENT - Ears normal. Throat and pharynx normal. Neck supple. No adenopathy or masses in the neck or supraclavicular regions. Nasal septum midline, no deformities, nares patent, normal mucosa without swelling, no polyps, no bleeding. Paranasal sinuses are normal. No tenderness to the maxillary, frontal, ethmoids or mastoids.  2+ tonsills.   Cardiac - S1 and S2 normal, no murmurs, clicks, gallops or rubs. Regular rate and rhythm.  No edema or JVD. Chest - Chest is clear, no wheezing or rales. Normal symmetric air entry throughout both lung fields. No chest wall deformities or tenderness. Back - scoliosis Abd - soft without tenderness, guarding, mass, rebound or organomegaly. Bowel sounds are normal. No CVA tenderness noted. Ext - good pulses, motor strength and sensation. Neuro - Cranial nerves are normal. PERLA. EOM's intact. Skin - no discernible active dermatitis, erythema, urticaria or inflammatory process. Psych - normal mood, and behavior.    Assessment/Plan:  [name], MD [place] [facility]/Sleep Pager:  [phone] 04/05/2012, 2:50 PM  Patient Instructions  Will arrange for CPAP set up at home.  Follow up in 6 to 8 weeks

## 2012-04-05 NOTE — Assessment & Plan Note (Signed)
She has mild sleep apnea on recent sleep study.  She has snoring, witnessed apnea, daytime sleepiness, and history of hypertension.  I have reviewed her sleep test results with the patient.  Explained how sleep apnea can affect the patient's health.  Driving precautions and importance of weight loss were discussed.  Treatment options for sleep apnea were reviewed.  Will arrange for auto CPAP at home.  Discussed mask fit.

## 2012-04-05 NOTE — Patient Instructions (Signed)
Will arrange for CPAP set up at home Follow up in 6 to 8 weeks 

## 2012-05-13 ENCOUNTER — Encounter: Payer: Self-pay | Admitting: Pulmonary Disease

## 2012-05-13 ENCOUNTER — Telehealth: Payer: Self-pay | Admitting: Pulmonary Disease

## 2012-05-13 NOTE — Telephone Encounter (Signed)
I spoke with patient about results and she verbalized understanding and had no questions 

## 2012-05-13 NOTE — Telephone Encounter (Signed)
Auto CPAP 04/13/12 to 05/06/12>>Used on 24 of 24 nights with average 6 hrs 27 min.  Average AHI 3.8 with median CPAP 8 cm H2O and 95th percentile CPAP 10 cm H2O.  Will have my nurse inform patient that CPAP report looked good.  No change to current set up needed.  Will review in more detail at [facility] in August.

## 2012-06-01 ENCOUNTER — Encounter: Payer: Self-pay | Admitting: Pulmonary Disease

## 2012-06-01 ENCOUNTER — Ambulatory Visit (INDEPENDENT_AMBULATORY_CARE_PROVIDER_SITE_OTHER): Payer: 59 | Admitting: Pulmonary Disease

## 2012-06-01 VITALS — BP 112/62 | HR 63 | Temp 97.8°F | Ht 59.0 in | Wt 227.8 lb

## 2012-06-01 DIAGNOSIS — G4733 Obstructive sleep apnea (adult) (pediatric): Secondary | ICD-10-CM

## 2012-06-01 NOTE — Progress Notes (Signed)
Chief Complaint  Patient presents with  . Follow-up    pt states she wears her cpap machine everynight x 6 hrs a night. Since being on cpap she has not been waking up choking.    History of Present Illness: Cassandra Kelley is a 57 y.o. female with OSA.  Auto CPAP 04/13/12 to 05/06/12>>Used on 24 of 24 nights with average 6 hrs 27 min. Average AHI 3.8 with median CPAP 8 cm H2O and 95th percentile CPAP 10 cm H2O.  She has been doing well with CPAP.  She is no longer waking up with a choking sensation.  She feels she is sleeping better, and more energetic during the day.  She is not having any trouble with her mask.  She sometimes gets tangled in her tubing.   Past Medical History  Diagnosis Date  . Hypertension   . Asthma   . High cholesterol   . Cervical cancer   . Chronic headaches   . OSA (obstructive sleep apnea)     Past Surgical History  Procedure Date  . Cholecystectomy   . Vaginal hysterectomy   . Tubal ligation   . Hemrr   . Hemorrhoid surgery     Outpatient Encounter Prescriptions as of 06/01/2012  Medication Sig Dispense Refill  . aspirin 81 MG tablet Take 81 mg by mouth daily.      . Calcium Carb-Cholecalciferol (CALCIUM PLUS VITAMIN D3 PO) 1 tablet twice a day      . Casanthranol-Docusate Sodium 30-100 MG CAPS 3 times daily      . cetirizine (KLS ALLER-TEC) 10 MG tablet Take 10 mg by mouth daily.      . cholecalciferol (VITAMIN D) 1000 UNITS tablet Take 1,000 Units by mouth daily.      . famotidine (PEPCID) 20 MG tablet Take 20 mg by mouth daily.      Marland Kitchen lactulose (CEPHULAC) 10 G packet Take 10 g by mouth daily.      . Multiple Vitamins-Minerals (MULTIVITAL PO) Take by mouth. Once a day      . oxyCODONE (OXYCONTIN) 15 MG TB12 4 times a day      . pravastatin (PRAVACHOL) 40 MG tablet Take 40 mg by mouth daily.      . valsartan-hydrochlorothiazide (DIOVAN-HCT) 160-12.5 MG per tablet Take 1 tablet by mouth daily.      Marland Kitchen DISCONTD: hydrochlorothiazide (MICROZIDE)  12.5 MG capsule Take 12.5 mg by mouth daily.      Marland Kitchen DISCONTD: valsartan (DIOVAN) 160 MG tablet Take 160 mg by mouth daily.        No Known Allergies  Physical Exam:  Filed Vitals:   06/01/12 1433  BP: 112/62  Pulse: 63  Temp: 97.8 F (36.6 C)  TempSrc: Oral  Height: 4\' 11"  (1.499 m)  Weight: 227 lb 12.8 oz (103.329 kg)  SpO2: 97%    Current Encounter SPO2  06/01/12 1433 97%  04/05/12 1417 99%     Body mass index is 46.01 kg/(m^2). Wt Readings from Last 2 Encounters:  06/01/12 227 lb 12.8 oz (103.329 kg)  04/05/12 225 lb 12.8 oz (102.422 kg)    General - No distress ENT - TM clear, no sinus tenderness, no oral exudate, no LAN, no thyromegaly Cardiac - s1s2 regular, no murmur, pulses symmetric, no edema Chest - normal respiratory excursion, good air entry, no wheeze/rales/dullness Back - no focal tenderness Abd - soft, non-tender, no organomegaly, + bowel sounds Ext - normal motor strength Neuro - Cranial nerves are  normal. PERLA. EOM's intact. Skin - no discernible active dermatitis, erythema, urticaria or inflammatory process. Psych - normal mood, and behavior.   Assessment/Plan:  [name], MD [place] [facility]/Critical [facility]/Sleep Pager:  [phone] 06/01/2012, 2:51 PM

## 2012-06-01 NOTE — Assessment & Plan Note (Signed)
She reports compliance with therapy and benefit from CPAP.  She is to continue CPAP.  Advised her to discuss with her DME if she needs to have mask refit.  Discussed how to avoid getting tangled in her tubing.

## 2012-06-01 NOTE — Patient Instructions (Signed)
Follow-up in one year.

## 2012-09-07 ENCOUNTER — Other Ambulatory Visit: Payer: Self-pay | Admitting: Internal Medicine

## 2012-09-11 ENCOUNTER — Ambulatory Visit
Admission: RE | Admit: 2012-09-11 | Discharge: 2012-09-11 | Disposition: A | Discharge status: Home / Self Care | Payer: 59 | Source: Ambulatory Visit | Attending: Internal Medicine | Admitting: Internal Medicine

## 2012-09-11 MED ORDER — GADOBENATE DIMEGLUMINE 529 MG/ML IV SOLN
20.0000 mL | Freq: Once | INTRAVENOUS | Status: AC | PRN
  Administered 2012-09-11: 20 mL via INTRAVENOUS

## 2012-12-22 ENCOUNTER — Telehealth: Payer: Self-pay | Admitting: Pulmonary Disease

## 2012-12-22 NOTE — Telephone Encounter (Signed)
I spoke with [name] and she stated they faxed over CMN yesterday 12/21/12. Calling to confirm we did receive this. Please advise [name]. thanks

## 2012-12-23 NOTE — Telephone Encounter (Signed)
Lauren from [facility] called back-did we receive the CMN or not?  Please advise.  Antionette Fairy

## 2012-12-24 NOTE — Telephone Encounter (Signed)
Called [facility] and had them fax to [phone] and i placed it on alidas desk Tobe Sos

## 2012-12-27 NOTE — Telephone Encounter (Signed)
Form filled out and put in Dr Craige Cotta blue folder for signature .Kandice Hams

## 2012-12-28 ENCOUNTER — Telehealth: Payer: Self-pay | Admitting: Pulmonary Disease

## 2012-12-28 NOTE — Telephone Encounter (Signed)
Spoke with [employer], we do have the cpap order, it is in Dr Dennard Schaumann folder waiting for signature, once signed I will fax.  No need to send another form. Kandice Hams

## 2012-12-28 NOTE — Telephone Encounter (Signed)
Called, spoke with [name]. States they faxed over a cpap rx on 3/4 and again on 3/7.  They faxed it to [phone].  Requesting the status of this.  [name], have you seen this fax yet?  Just in case it has not been received given it was not faxed to our main fax #, I have asked them to refax to triage.

## 2013-02-07 ENCOUNTER — Encounter: Payer: Self-pay | Admitting: *Deleted

## 2013-08-12 ENCOUNTER — Emergency Department (HOSPITAL_COMMUNITY): Payer: Medicare Other

## 2013-08-12 ENCOUNTER — Encounter: Payer: 59 | Admitting: Family Medicine

## 2013-08-12 ENCOUNTER — Emergency Department (HOSPITAL_COMMUNITY)
Admission: EM | Admit: 2013-08-12 | Discharge: 2013-08-12 | Disposition: A | Discharge status: Home / Self Care | Payer: Medicare Other | Attending: Emergency Medicine | Admitting: Emergency Medicine

## 2013-08-12 DIAGNOSIS — I10 Essential (primary) hypertension: Secondary | ICD-10-CM | POA: Insufficient documentation

## 2013-08-12 DIAGNOSIS — J45909 Unspecified asthma, uncomplicated: Secondary | ICD-10-CM | POA: Insufficient documentation

## 2013-08-12 DIAGNOSIS — S8990XA Unspecified injury of unspecified lower leg, initial encounter: Secondary | ICD-10-CM | POA: Insufficient documentation

## 2013-08-12 DIAGNOSIS — S8991XA Unspecified injury of right lower leg, initial encounter: Secondary | ICD-10-CM

## 2013-08-12 DIAGNOSIS — Y939 Activity, unspecified: Secondary | ICD-10-CM | POA: Insufficient documentation

## 2013-08-12 DIAGNOSIS — E7800 Pure hypercholesterolemia, unspecified: Secondary | ICD-10-CM | POA: Insufficient documentation

## 2013-08-12 DIAGNOSIS — Y929 Unspecified place or not applicable: Secondary | ICD-10-CM | POA: Insufficient documentation

## 2013-08-12 DIAGNOSIS — Z8541 Personal history of malignant neoplasm of cervix uteri: Secondary | ICD-10-CM | POA: Insufficient documentation

## 2013-08-12 DIAGNOSIS — Z79899 Other long term (current) drug therapy: Secondary | ICD-10-CM | POA: Insufficient documentation

## 2013-08-12 DIAGNOSIS — X500XXA Overexertion from strenuous movement or load, initial encounter: Secondary | ICD-10-CM | POA: Insufficient documentation

## 2013-08-12 DIAGNOSIS — Z7982 Long term (current) use of aspirin: Secondary | ICD-10-CM | POA: Insufficient documentation

## 2013-08-12 DIAGNOSIS — Z8669 Personal history of other diseases of the nervous system and sense organs: Secondary | ICD-10-CM | POA: Insufficient documentation

## 2013-08-12 MED ORDER — OXYCODONE-ACETAMINOPHEN 5-325 MG PO TABS
2.0000 | ORAL_TABLET | Freq: Once | ORAL | Status: AC
  Administered 2013-08-12: 2 via ORAL
  Filled 2013-08-12: qty 2

## 2013-08-12 NOTE — ED Notes (Signed)
Pt c/o right knee pain from a fall and reports a sudden change of direction.

## 2013-08-12 NOTE — ED Provider Notes (Signed)
CSN: [id number]     Arrival date & time 08/12/13  1643 History  This chart was scribed for Emilia Beck, PA working with Toy Baker, MD by Quintella Reichert, ED Scribe. This patient was seen in room WTR8/WTR8 and the patient's care was started at 5:47 PM.   Chief Complaint  Patient presents with  . Knee Pain    The history is provided by the patient. No language interpreter was used.    HPI Comments: Cassandra Kelley is a 57 y.o. female who presents to the Emergency Department complaining of right knee pain subsequent to an injury.  Pt states that last night she "somehow twisted around and fell" and she immediately developed constant moderate-to-severe pain to the knee.  Pain is present constantly but is worsened by movement.  She also notes that her knee feels weak, "like I can't do anything with it."  She takes hydrocodone 15 mg every 6 hours for her chronic back pain.  This has not relieved her knee pain.  Pt notes that she fractured her left knee over a year ago and her initial x-ray did not reveal fracture but a later repeat x-ray did reveal fracture.   Past Medical History  Diagnosis Date  . Hypertension   . Asthma   . High cholesterol   . Cervical cancer   . Chronic headaches   . OSA (obstructive sleep apnea)     Past Surgical History  Procedure Laterality Date  . Cholecystectomy    . Vaginal hysterectomy    . Tubal ligation    . Hemrr    . Hemorrhoid surgery      Family History  Problem Relation Age of Onset  . Lung cancer Mother   . Cancer Maternal Aunt   . Heart disease Maternal Grandfather   . Heart disease Maternal Grandmother   . Heart disease Paternal Grandfather   . Heart disease Paternal Grandmother   . Asthma Son   . Asthma Father   . Asthma Maternal Uncle   . Allergies      everyone    History  Substance Use Topics  . Smoking status: Never Smoker   . Smokeless tobacco: Not on file  . Alcohol Use: No    OB History   Grav Para Term  Preterm Abortions TAB SAB Ect Mult Living                  Review of Systems  All other systems reviewed and are negative.     Allergies  Review of patient's allergies indicates no known allergies.  Home Medications   Current Outpatient Rx  Name  Route  Sig  Dispense  Refill  . aspirin 81 MG tablet   Oral   Take 81 mg by mouth daily.         . Calcium Carb-Cholecalciferol (CALCIUM PLUS VITAMIN D3 PO)      1 tablet twice a day         . Casanthranol-Docusate Sodium 30-100 MG CAPS      3 times daily         . cetirizine (KLS ALLER-TEC) 10 MG tablet   Oral   Take 10 mg by mouth daily.         . cholecalciferol (VITAMIN D) 1000 UNITS tablet   Oral   Take 1,000 Units by mouth daily.         . famotidine (PEPCID) 20 MG tablet   Oral   Take 20 mg  by mouth daily.         Marland Kitchen lactulose (CEPHULAC) 10 G packet   Oral   Take 10 g by mouth daily.         . Multiple Vitamins-Minerals (MULTIVITAL PO)   Oral   Take by mouth. Once a day         . oxyCODONE (OXYCONTIN) 15 MG TB12      4 times a day         . pravastatin (PRAVACHOL) 40 MG tablet   Oral   Take 40 mg by mouth daily.         . valsartan-hydrochlorothiazide (DIOVAN-HCT) 160-12.5 MG per tablet   Oral   Take 1 tablet by mouth daily.          BP 115/58  Pulse 70  Temp(Src) 98.7 F (37.1 C) (Oral)  Resp 16  SpO2 100%  Physical Exam  Nursing note and vitals reviewed. Constitutional: She is oriented to person, place, and time. She appears well-developed and well-nourished. No distress.  HENT:  Head: Normocephalic and atraumatic.  Eyes: EOM are normal.  Neck: Neck supple. No tracheal deviation present.  Cardiovascular: Normal rate.   Pulmonary/Chest: Effort normal. No respiratory distress.  Musculoskeletal:  Right lateral knee tenderness to palpation Edema and ROM difficult to assess due to body habitus No obvious deformity  Neurological: She is alert and oriented to person,  place, and time.  Skin: Skin is warm and dry.  Psychiatric: She has a normal mood and affect. Her behavior is normal.    ED Course  Procedures (including critical care time)  DIAGNOSTIC STUDIES: Oxygen Saturation is 100% on room air, normal by my interpretation.    COORDINATION OF CARE: 5:52 PM: Discussed treatment plan which includes ice and orthopedic referral.  Pt expressed understanding and agreed to plan.  Labs Review Labs Reviewed - No data to display  Imaging Review Dg Knee Complete 4 Views Right  08/12/2013   CLINICAL DATA:  Pain lateral right knee after twisting injury  EXAM: RIGHT KNEE - COMPLETE 4+ VIEW  COMPARISON:  None.  FINDINGS: No fracture or dislocation. Severe narrowing and osteophyte of the medial compartment. Mild narrowing and moderate osteophyte formation of the lateral compartment. There is a moderate joint effusion. There is severe narrowing and osteophyte formation of the patellofemoral compartment.  IMPRESSION: No acute findings. Severe degenerative joint disease.   Electronically Signed   By: Esperanza Heir M.D.   On: 08/12/2013 17:43    EKG Interpretation   None       MDM   1. Right knee injury, initial encounter    5:59 PM Xray shows no acute changes. Patient will have Percocet here and ACE wrap. Patient is seeing a chronic pain clinic and will be unable to have pain prescriptions. No neurovascular compromise. Vitals stable and patient afebrile    I personally performed the services described in this documentation, which was scribed in my presence. The recorded information has been reviewed and is accurate.    Emilia Beck, PA-C 08/12/13 1800

## 2013-08-12 NOTE — Discharge Instructions (Signed)
Apply ice and keep elevated for pain relief. Refer to attached documents for more information. Follow up with the recommended Orthopedist for further evaluation and management.

## 2013-08-13 NOTE — ED Provider Notes (Signed)
Medical screening examination/treatment/procedure(s) were performed by non-physician practitioner and as supervising physician I was immediately available for consultation/collaboration.  Toy Baker, MD 08/13/13 2245

## 2014-06-19 ENCOUNTER — Other Ambulatory Visit: Payer: Self-pay

## 2014-06-20 LAB — CYTOLOGY - PAP

## 2014-06-27 ENCOUNTER — Emergency Department (HOSPITAL_COMMUNITY)
Admission: EM | Admit: 2014-06-27 | Discharge: 2014-06-27 | Disposition: A | Discharge status: Home / Self Care | Payer: Medicare Other | Attending: Emergency Medicine | Admitting: Emergency Medicine

## 2014-06-27 ENCOUNTER — Encounter (HOSPITAL_COMMUNITY): Payer: Self-pay | Admitting: Emergency Medicine

## 2014-06-27 ENCOUNTER — Emergency Department (HOSPITAL_COMMUNITY): Payer: Medicare Other

## 2014-06-27 DIAGNOSIS — R319 Hematuria, unspecified: Secondary | ICD-10-CM | POA: Insufficient documentation

## 2014-06-27 DIAGNOSIS — E785 Hyperlipidemia, unspecified: Secondary | ICD-10-CM | POA: Insufficient documentation

## 2014-06-27 DIAGNOSIS — Z8541 Personal history of malignant neoplasm of cervix uteri: Secondary | ICD-10-CM | POA: Insufficient documentation

## 2014-06-27 DIAGNOSIS — Z7982 Long term (current) use of aspirin: Secondary | ICD-10-CM | POA: Diagnosis not present

## 2014-06-27 DIAGNOSIS — G8929 Other chronic pain: Secondary | ICD-10-CM | POA: Diagnosis not present

## 2014-06-27 DIAGNOSIS — Z79899 Other long term (current) drug therapy: Secondary | ICD-10-CM | POA: Diagnosis not present

## 2014-06-27 DIAGNOSIS — Z8669 Personal history of other diseases of the nervous system and sense organs: Secondary | ICD-10-CM | POA: Insufficient documentation

## 2014-06-27 DIAGNOSIS — N3090 Cystitis, unspecified without hematuria: Secondary | ICD-10-CM | POA: Diagnosis not present

## 2014-06-27 DIAGNOSIS — J45909 Unspecified asthma, uncomplicated: Secondary | ICD-10-CM | POA: Insufficient documentation

## 2014-06-27 DIAGNOSIS — I10 Essential (primary) hypertension: Secondary | ICD-10-CM | POA: Insufficient documentation

## 2014-06-27 LAB — URINALYSIS, ROUTINE W REFLEX MICROSCOPIC
Bilirubin Urine: NEGATIVE
Glucose, UA: NEGATIVE mg/dL
KETONES UR: NEGATIVE mg/dL
Nitrite: POSITIVE — AB
Specific Gravity, Urine: 1.025 (ref 1.005–1.030)
UROBILINOGEN UA: 1 mg/dL (ref 0.0–1.0)
pH: 5.5 (ref 5.0–8.0)

## 2014-06-27 LAB — URINE MICROSCOPIC-ADD ON

## 2014-06-27 LAB — I-STAT CHEM 8, ED
BUN: 15 mg/dL (ref 6–23)
CREATININE: 0.8 mg/dL (ref 0.50–1.10)
Calcium, Ion: 1.14 mmol/L (ref 1.12–1.23)
Chloride: 104 mEq/L (ref 96–112)
Glucose, Bld: 88 mg/dL (ref 70–99)
HCT: 39 % (ref 36.0–46.0)
HEMOGLOBIN: 13.3 g/dL (ref 12.0–15.0)
POTASSIUM: 3.6 meq/L — AB (ref 3.7–5.3)
Sodium: 138 mEq/L (ref 137–147)
TCO2: 25 mmol/L (ref 0–100)

## 2014-06-27 MED ORDER — PHENAZOPYRIDINE HCL 200 MG PO TABS: 200.0000 mg | ORAL_TABLET | Freq: Three times a day (TID) | ORAL | Status: DC

## 2014-06-27 MED ORDER — IBUPROFEN 800 MG PO TABS: 800.0000 mg | ORAL_TABLET | Freq: Three times a day (TID) | ORAL | Status: DC

## 2014-06-27 MED ORDER — DICYCLOMINE HCL 10 MG/ML IM SOLN
20.0000 mg | Freq: Once | INTRAMUSCULAR | Status: AC
  Administered 2014-06-27: 20 mg via INTRAMUSCULAR
  Filled 2014-06-27: qty 2

## 2014-06-27 MED ORDER — KETOROLAC TROMETHAMINE 60 MG/2ML IM SOLN
60.0000 mg | Freq: Once | INTRAMUSCULAR | Status: AC
  Administered 2014-06-27: 60 mg via INTRAMUSCULAR
  Filled 2014-06-27: qty 2

## 2014-06-27 MED ORDER — PHENAZOPYRIDINE HCL 200 MG PO TABS
200.0000 mg | ORAL_TABLET | Freq: Three times a day (TID) | ORAL | Status: DC
Start: 2014-06-27 — End: 2014-06-27
  Administered 2014-06-27: 200 mg via ORAL
  Filled 2014-06-27: qty 1

## 2014-06-27 MED ORDER — CIPROFLOXACIN HCL 500 MG PO TABS: 500.0000 mg | ORAL_TABLET | Freq: Two times a day (BID) | ORAL | Status: DC

## 2014-06-27 MED ORDER — CIPROFLOXACIN HCL 500 MG PO TABS
500.0000 mg | ORAL_TABLET | Freq: Once | ORAL | Status: AC
  Administered 2014-06-27: 500 mg via ORAL
  Filled 2014-06-27: qty 1

## 2014-06-27 NOTE — ED Provider Notes (Signed)
CSN: [id number]     Arrival date & time 06/27/14  0128 History   First MD Initiated Contact with Patient 06/27/14 0310     Chief Complaint  Patient presents with  . Dysuria  . Hematuria     (Consider location/radiation/quality/duration/timing/severity/associated sxs/prior Treatment) Patient is a 58 y.o. female presenting with dysuria and hematuria. The history is provided by the patient. No language interpreter was used.  Dysuria Pain quality:  Burning Pain severity:  Severe Onset quality:  Gradual Timing:  Constant Progression:  Unchanged Chronicity:  New Recent urinary tract infections: yes   Relieved by:  Cranberry juice and phenazopyridine Worsened by:  Nothing tried Ineffective treatments:  None tried Urinary symptoms: no discolored urine   Associated symptoms: no abdominal pain   Risk factors: no renal disease   Hematuria Pertinent negatives include no abdominal pain.    Past Medical History  Diagnosis Date  . Hypertension   . Asthma   . High cholesterol   . Cervical cancer   . Chronic headaches   . OSA (obstructive sleep apnea)    Past Surgical History  Procedure Laterality Date  . Cholecystectomy    . Vaginal hysterectomy    . Tubal ligation    . Hemrr    . Hemorrhoid surgery     Family History  Problem Relation Age of Onset  . Lung cancer Mother   . Cancer Maternal Aunt   . Heart disease Maternal Grandfather   . Heart disease Maternal Grandmother   . Heart disease Paternal Grandfather   . Heart disease Paternal Grandmother   . Asthma Son   . Asthma Father   . Asthma Maternal Uncle   . Allergies      everyone   History  Substance Use Topics  . Smoking status: Never Smoker   . Smokeless tobacco: Not on file  . Alcohol Use: No   OB History   Grav Para Term Preterm Abortions TAB SAB Ect Mult Living                 Review of Systems  Gastrointestinal: Negative for abdominal pain.  Genitourinary: Positive for dysuria and hematuria.  All  other systems reviewed and are negative.     Allergies  Review of patient's allergies indicates no known allergies.  Home Medications   Prior to Admission medications   Medication Sig Start Date End Date Taking? Authorizing Provider  aspirin 81 MG tablet Take 81 mg by mouth daily.   Yes Historical Provider, MD  Calcium Carb-Cholecalciferol (CALCIUM PLUS VITAMIN D3 PO) 1 tablet twice a day   Yes Historical Provider, MD  cetirizine (ZYRTEC) 10 MG tablet Take 10 mg by mouth daily.   Yes Historical Provider, MD  cholecalciferol (VITAMIN D) 1000 UNITS tablet Take 1,000 Units by mouth daily.   Yes Historical Provider, MD  docusate sodium (COLACE) 100 MG capsule Take 300 mg by mouth daily.   Yes Historical Provider, MD  famotidine (PEPCID) 20 MG tablet Take 20 mg by mouth daily.   Yes Historical Provider, MD  lactulose (CHRONULAC) 10 GM/15ML solution Take 30 g by mouth as directed. Patient alternates with movantik for regularity   Yes Historical Provider, MD  Multiple Vitamins-Minerals (MULTIVITAL PO) Take by mouth. Once a day   Yes Historical Provider, MD  Naloxegol Oxalate (MOVANTIK) 25 MG TABS Take 1 tablet by mouth as directed. Patient alternates with lactulose for regularity   Yes Historical Provider, MD  oxyCODONE (OXYCONTIN) 15 MG TB12 Take 15  mg by mouth 4 (four) times daily. 4 times a day   Yes Historical Provider, MD  pravastatin (PRAVACHOL) 40 MG tablet Take 40 mg by mouth daily.   Yes Historical Provider, MD   BP 117/53  Pulse 75  Temp(Src) 97.8 F (36.6 C) (Oral)  Resp 18  SpO2 100% Physical Exam  Constitutional: She is oriented to person, place, and time. She appears well-developed and well-nourished. No distress.  HENT:  Head: Normocephalic and atraumatic.  Mouth/Throat: Oropharynx is clear and moist.  Eyes: Conjunctivae are normal. Pupils are equal, round, and reactive to light.  Neck: Normal range of motion.  Cardiovascular: Normal rate, regular rhythm and intact distal  pulses.   Pulmonary/Chest: Effort normal and breath sounds normal. She has no wheezes. She has no rales.  Abdominal: Soft. Bowel sounds are normal. There is no tenderness. There is no rebound and no guarding.  Musculoskeletal: Normal range of motion.  Neurological: She is alert and oriented to person, place, and time.  Skin: Skin is warm and dry.  Psychiatric: She has a normal mood and affect.    ED Course  Procedures (including critical care time) Labs Review Labs Reviewed  URINALYSIS, ROUTINE W REFLEX MICROSCOPIC - Abnormal; Notable for the following:    Color, Urine BROWN (*)    APPearance TURBID (*)    Hgb urine dipstick LARGE (*)    Protein, ur >300 (*)    Nitrite POSITIVE (*)    Leukocytes, UA MODERATE (*)    All other components within normal limits  URINE MICROSCOPIC-ADD ON - Abnormal; Notable for the following:    Squamous Epithelial / LPF FEW (*)    Bacteria, UA FIELD OBSCURED BY WBC'S (*)    Crystals CA OXALATE CRYSTALS (*)    All other components within normal limits  URINE CULTURE    Imaging Review No results found.   EKG Interpretation None      MDM   Final diagnoses:  None    Black and yellow capsule was macrobid as EDP ID it.  Will choose 10 days of cipro and sent culture.  Will start pyridium and ibuprofen in addition to patient's current narcotic regimen    Berdella Bacot K Tajon Moring-Rasch, MD 06/27/14 6761

## 2014-06-27 NOTE — Discharge Instructions (Signed)

## 2014-06-27 NOTE — ED Notes (Signed)
Pt states she has urinary tract infection symptoms with burning, lower abd pain, flank pain, and hematuria  Pt states she was seen by her dr last week for the same and was given antibiotics  Pt states she finished her antibiotics on Friday  Pt states they did a culture on her urine that came back negative  Pt states her symptoms have gotten worse

## 2014-06-29 LAB — URINE CULTURE: Colony Count: >=100000

## 2014-07-01 ENCOUNTER — Telehealth (HOSPITAL_COMMUNITY): Payer: Self-pay | Admitting: *Deleted

## 2014-07-01 NOTE — ED Notes (Signed)
(+)  urine culture, treated with ciprofloxacin, OK per J. Frens

## 2014-10-15 ENCOUNTER — Encounter (HOSPITAL_COMMUNITY): Payer: Self-pay | Admitting: *Deleted

## 2014-10-15 ENCOUNTER — Emergency Department (HOSPITAL_COMMUNITY): Payer: Medicare Other

## 2014-10-15 ENCOUNTER — Emergency Department (HOSPITAL_COMMUNITY)
Admission: EM | Admit: 2014-10-15 | Discharge: 2014-10-15 | Disposition: A | Discharge status: Home / Self Care | Payer: Medicare Other | Attending: Emergency Medicine | Admitting: Emergency Medicine

## 2014-10-15 DIAGNOSIS — Z9851 Tubal ligation status: Secondary | ICD-10-CM | POA: Diagnosis not present

## 2014-10-15 DIAGNOSIS — Z8541 Personal history of malignant neoplasm of cervix uteri: Secondary | ICD-10-CM | POA: Diagnosis not present

## 2014-10-15 DIAGNOSIS — Z8669 Personal history of other diseases of the nervous system and sense organs: Secondary | ICD-10-CM | POA: Insufficient documentation

## 2014-10-15 DIAGNOSIS — G8929 Other chronic pain: Secondary | ICD-10-CM | POA: Diagnosis not present

## 2014-10-15 DIAGNOSIS — Z9089 Acquired absence of other organs: Secondary | ICD-10-CM | POA: Insufficient documentation

## 2014-10-15 DIAGNOSIS — R109 Unspecified abdominal pain: Secondary | ICD-10-CM

## 2014-10-15 DIAGNOSIS — E780 Pure hypercholesterolemia: Secondary | ICD-10-CM | POA: Diagnosis not present

## 2014-10-15 DIAGNOSIS — Z791 Long term (current) use of non-steroidal anti-inflammatories (NSAID): Secondary | ICD-10-CM | POA: Insufficient documentation

## 2014-10-15 DIAGNOSIS — Z90710 Acquired absence of both cervix and uterus: Secondary | ICD-10-CM | POA: Insufficient documentation

## 2014-10-15 DIAGNOSIS — J45909 Unspecified asthma, uncomplicated: Secondary | ICD-10-CM | POA: Insufficient documentation

## 2014-10-15 DIAGNOSIS — K5900 Constipation, unspecified: Secondary | ICD-10-CM | POA: Diagnosis not present

## 2014-10-15 DIAGNOSIS — Z7982 Long term (current) use of aspirin: Secondary | ICD-10-CM | POA: Insufficient documentation

## 2014-10-15 DIAGNOSIS — I10 Essential (primary) hypertension: Secondary | ICD-10-CM | POA: Diagnosis not present

## 2014-10-15 DIAGNOSIS — Z792 Long term (current) use of antibiotics: Secondary | ICD-10-CM | POA: Insufficient documentation

## 2014-10-15 DIAGNOSIS — Z79899 Other long term (current) drug therapy: Secondary | ICD-10-CM | POA: Insufficient documentation

## 2014-10-15 HISTORY — DX: Constipation, unspecified: K59.00

## 2014-10-15 LAB — URINALYSIS, ROUTINE W REFLEX MICROSCOPIC
Bilirubin Urine: NEGATIVE
Glucose, UA: NEGATIVE mg/dL
Hgb urine dipstick: NEGATIVE
Ketones, ur: NEGATIVE mg/dL
Nitrite: NEGATIVE
PROTEIN: NEGATIVE mg/dL
Specific Gravity, Urine: 1.014 (ref 1.005–1.030)
UROBILINOGEN UA: 1 mg/dL (ref 0.0–1.0)
pH: 6 (ref 5.0–8.0)

## 2014-10-15 LAB — COMPREHENSIVE METABOLIC PANEL
ALT: 16 U/L (ref 0–35)
ANION GAP: 9 (ref 5–15)
AST: 27 U/L (ref 0–37)
Albumin: 4.2 g/dL (ref 3.5–5.2)
Alkaline Phosphatase: 75 U/L (ref 39–117)
BUN: 13 mg/dL (ref 6–23)
CO2: 24 mmol/L (ref 19–32)
CREATININE: 0.83 mg/dL (ref 0.50–1.10)
Calcium: 9 mg/dL (ref 8.4–10.5)
Chloride: 102 mEq/L (ref 96–112)
GFR calc Af Amer: 88 mL/min — ABNORMAL LOW (ref >90)
GFR calc non Af Amer: 76 mL/min — ABNORMAL LOW (ref >90)
Glucose, Bld: 101 mg/dL — ABNORMAL HIGH (ref 70–99)
POTASSIUM: 3.5 mmol/L (ref 3.5–5.1)
Sodium: 135 mmol/L (ref 135–145)
TOTAL PROTEIN: 7.5 g/dL (ref 6.0–8.3)
Total Bilirubin: 0.8 mg/dL (ref 0.3–1.2)

## 2014-10-15 LAB — CBC WITH DIFFERENTIAL/PLATELET
BASOS PCT: 1 % (ref 0–1)
Basophils Absolute: 0.1 10*3/uL (ref 0.0–0.1)
EOS ABS: 0.4 10*3/uL (ref 0.0–0.7)
EOS PCT: 4 % (ref 0–5)
HCT: 42.4 % (ref 36.0–46.0)
Hemoglobin: 14.4 g/dL (ref 12.0–15.0)
Lymphocytes Relative: 21 % (ref 12–46)
Lymphs Abs: 1.9 10*3/uL (ref 0.7–4.0)
MCH: 30.3 pg (ref 26.0–34.0)
MCHC: 34 g/dL (ref 30.0–36.0)
MCV: 89.1 fL (ref 78.0–100.0)
MONOS PCT: 10 % (ref 3–12)
Monocytes Absolute: 0.9 10*3/uL (ref 0.1–1.0)
NEUTROS PCT: 64 % (ref 43–77)
Neutro Abs: 6 10*3/uL (ref 1.7–7.7)
Platelets: 283 10*3/uL (ref 150–400)
RBC: 4.76 MIL/uL (ref 3.87–5.11)
RDW: 12.6 % (ref 11.5–15.5)
WBC: 9.3 10*3/uL (ref 4.0–10.5)

## 2014-10-15 LAB — URINE MICROSCOPIC-ADD ON

## 2014-10-15 LAB — LIPASE, BLOOD: LIPASE: 20 U/L (ref 11–59)

## 2014-10-15 NOTE — ED Notes (Signed)
Pt states having lower abdominal pain w/ diarrhea, nausea and vomiting  x 3 weeks, pt states has only vomited 2-3 times in 3 weeks, states her stools do have bright red blood in them.

## 2014-10-15 NOTE — Discharge Instructions (Signed)

## 2014-10-15 NOTE — ED Provider Notes (Signed)
CSN: [id number]     Arrival date & time 10/15/14  2008 History   First MD Initiated Contact with Patient 10/15/14 2105     Chief Complaint  Patient presents with  . Abdominal Pain    HPI SYmptoms started about 3 weeks ago.  She has been having pain in the abdomen especially after using her movantik.   She also noticed blood in her stool intermittently over the last few weeks.  Her appetite has been poor and she feels nauseated.  Vomited maybe 2-3 times in the last few weeks .  None recently.  She saw her doctor on the 11th.  She had a rectal exam and was told it was normal.  She was instructed to return if her symptoms persisted.  No fevers measured but she has felt sweaty at times.  No dysuria.  In the past she has had an ileus.  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  She takes movantik to assist with that. Past Medical History  Diagnosis Date  . Hypertension   . Asthma   . High cholesterol   . Chronic headaches   . OSA (obstructive sleep apnea)   . Cervical cancer dx'd ~ 2005  . Constipation    Past Surgical History  Procedure Laterality Date  . Cholecystectomy    . Vaginal hysterectomy    . Tubal ligation    . Hemrr    . Hemorrhoid surgery     Family History  Problem Relation Age of Onset  . Lung cancer Mother   . Cancer Maternal Aunt   . Heart disease Maternal Grandfather   . Heart disease Maternal Grandmother   . Heart disease Paternal Grandfather   . Heart disease Paternal Grandmother   . Asthma Son   . Asthma Father   . Asthma Maternal Uncle   . Allergies      everyone   History  Substance Use Topics  . Smoking status: Never Smoker   . Smokeless tobacco: Not on file  . Alcohol Use: No   OB History    No data available     Review of Systems  All other systems reviewed and are negative.     Allergies  Review of patient's allergies indicates no known allergies.  Home Medications   Prior to Admission  medications   Medication Sig Start Date End Date Taking? Authorizing Provider  aspirin 81 MG tablet Take 81 mg by mouth daily.   Yes Historical Provider, MD  Calcium Carb-Cholecalciferol (CALCIUM PLUS VITAMIN D3 PO) 1 tablet twice a day   Yes Historical Provider, MD  cetirizine (ZYRTEC) 10 MG tablet Take 10 mg by mouth daily.   Yes Historical Provider, MD  docusate sodium (COLACE) 100 MG capsule Take 300 mg by mouth daily.   Yes Historical Provider, MD  famotidine (PEPCID) 20 MG tablet Take 20 mg by mouth daily.   Yes Historical Provider, MD  lactulose (CHRONULAC) 10 GM/15ML solution Take 30 g by mouth as directed. Patient alternates with movantik for regularity   Yes Historical Provider, MD  Multiple Vitamins-Minerals (MULTIVITAL PO) Take by mouth. Once a day   Yes Historical Provider, MD  Naloxegol Oxalate (MOVANTIK) 25 MG TABS Take 1 tablet by mouth as directed. Patient alternates with lactulose for regularity   Yes Historical Provider, MD  oxyCODONE (OXYCONTIN) 15 MG TB12 Take 15 mg by mouth 4 (four) times daily. 4 times a day   Yes Historical Provider,  MD  pravastatin (PRAVACHOL) 40 MG tablet Take 40 mg by mouth daily.   Yes Historical Provider, MD  amoxicillin (AMOXIL) 500 MG capsule Take 500 mg by mouth 2 (two) times daily.    Historical Provider, MD  ciprofloxacin (CIPRO) 500 MG tablet Take 1 tablet (500 mg total) by mouth every 12 (twelve) hours. Patient not taking: Reported on 10/15/2014 06/27/14   April K Palumbo-Rasch, MD  ibuprofen (ADVIL,MOTRIN) 800 MG tablet Take 1 tablet (800 mg total) by mouth 3 (three) times daily. Patient not taking: Reported on 10/15/2014 06/27/14   April K Palumbo-Rasch, MD  phenazopyridine (PYRIDIUM) 200 MG tablet Take 1 tablet (200 mg total) by mouth 3 (three) times daily. Patient not taking: Reported on 10/15/2014 06/27/14   April K Palumbo-Rasch, MD   BP 100/77 mmHg  Pulse 80  Temp(Src) 98.2 F (36.8 C) (Oral)  Resp 16  SpO2 98% Physical Exam   Constitutional: She appears well-developed and well-nourished. No distress.  HENT:  Head: Normocephalic and atraumatic.  Right Ear: External ear normal.  Left Ear: External ear normal.  Eyes: Conjunctivae are normal. Right eye exhibits no discharge. Left eye exhibits no discharge. No scleral icterus.  Neck: Neck supple. No tracheal deviation present.  Cardiovascular: Normal rate, regular rhythm and intact distal pulses.   Pulmonary/Chest: Effort normal and breath sounds normal. No stridor. No respiratory distress. She has no wheezes. She has no rales.  Abdominal: Soft. Bowel sounds are normal. She exhibits no distension. There is no tenderness. There is no rebound and no guarding.  Musculoskeletal: She exhibits no edema or tenderness.  Neurological: She is alert. She has normal strength. No cranial nerve deficit (no facial droop, extraocular movements intact, no slurred speech) or sensory deficit. She exhibits normal muscle tone. She displays no seizure activity. Coordination normal.  Skin: Skin is warm and dry. No rash noted.  Psychiatric: She has a normal mood and affect.  Nursing note and vitals reviewed.   ED Course  Procedures (including critical care time) Labs Review Labs Reviewed  COMPREHENSIVE METABOLIC PANEL - Abnormal; Notable for the following:    Glucose, Bld 101 (*)    GFR calc non Af Amer 76 (*)    GFR calc Af Amer 88 (*)    All other components within normal limits  CBC WITH DIFFERENTIAL  LIPASE, BLOOD    Imaging Review Dg Abd Acute W/chest  10/15/2014   CLINICAL DATA:  Lower abdominal pain with diarrhea, nausea and vomiting for 3 weeks. Right red blood within stools.  EXAM: ACUTE ABDOMEN SERIES (ABDOMEN 2 VIEW & CHEST 1 VIEW)  COMPARISON:  CT 06/27/2014  FINDINGS: Prior cholecystectomy. Nonobstructive bowel gas pattern. Moderate stool within the colon. No organomegaly or suspicious calcification.  Heart and mediastinal contours are within normal limits. No focal  opacities or effusions. No acute bony abnormality.  IMPRESSION: No evidence of obstruction or free air.  No acute findings.   Electronically Signed   By: Rolm Baptise M.D.   On: 10/15/2014 22:12      MDM   Final diagnoses:  Abdominal pain    Labs and xrays reassuring.  Doubt acute infection, obstruction, ileus.  Pt has chronic pain issues.  Sx could be related to her narcotics and use of movantix to manage her constipation.  Discussed follow up with her primary care doctor and GI doctor.  Consider colonoscopy as outpatient.  Warning signs and precautions.    Dorie Rank, MD 10/15/14 [phone]

## 2014-10-23 ENCOUNTER — Encounter (HOSPITAL_COMMUNITY): Payer: Self-pay | Admitting: Emergency Medicine

## 2014-10-23 ENCOUNTER — Emergency Department (HOSPITAL_COMMUNITY)
Admission: EM | Admit: 2014-10-23 | Discharge: 2014-10-23 | Disposition: A | Discharge status: Home / Self Care | Payer: Medicare Other | Attending: Emergency Medicine | Admitting: Emergency Medicine

## 2014-10-23 DIAGNOSIS — Z792 Long term (current) use of antibiotics: Secondary | ICD-10-CM | POA: Insufficient documentation

## 2014-10-23 DIAGNOSIS — E785 Hyperlipidemia, unspecified: Secondary | ICD-10-CM | POA: Insufficient documentation

## 2014-10-23 DIAGNOSIS — Z7982 Long term (current) use of aspirin: Secondary | ICD-10-CM | POA: Diagnosis not present

## 2014-10-23 DIAGNOSIS — E876 Hypokalemia: Secondary | ICD-10-CM | POA: Insufficient documentation

## 2014-10-23 DIAGNOSIS — Z8541 Personal history of malignant neoplasm of cervix uteri: Secondary | ICD-10-CM | POA: Diagnosis not present

## 2014-10-23 DIAGNOSIS — Z8669 Personal history of other diseases of the nervous system and sense organs: Secondary | ICD-10-CM | POA: Insufficient documentation

## 2014-10-23 DIAGNOSIS — Z79899 Other long term (current) drug therapy: Secondary | ICD-10-CM | POA: Diagnosis not present

## 2014-10-23 DIAGNOSIS — R1110 Vomiting, unspecified: Secondary | ICD-10-CM | POA: Diagnosis present

## 2014-10-23 DIAGNOSIS — K5900 Constipation, unspecified: Secondary | ICD-10-CM | POA: Insufficient documentation

## 2014-10-23 DIAGNOSIS — J45909 Unspecified asthma, uncomplicated: Secondary | ICD-10-CM | POA: Insufficient documentation

## 2014-10-23 DIAGNOSIS — R1115 Cyclical vomiting syndrome unrelated to migraine: Secondary | ICD-10-CM

## 2014-10-23 DIAGNOSIS — I10 Essential (primary) hypertension: Secondary | ICD-10-CM | POA: Diagnosis not present

## 2014-10-23 LAB — I-STAT CHEM 8, ED
BUN: 13 mg/dL (ref 6–23)
CALCIUM ION: 1.09 mmol/L — AB (ref 1.12–1.23)
CREATININE: 0.7 mg/dL (ref 0.50–1.10)
Chloride: 105 mEq/L (ref 96–112)
GLUCOSE: 93 mg/dL (ref 70–99)
HCT: 43 % (ref 36.0–46.0)
Hemoglobin: 14.6 g/dL (ref 12.0–15.0)
Potassium: 3.2 mmol/L — ABNORMAL LOW (ref 3.5–5.1)
Sodium: 141 mmol/L (ref 135–145)
TCO2: 20 mmol/L (ref 0–100)

## 2014-10-23 LAB — CBC WITH DIFFERENTIAL/PLATELET
BASOS PCT: 0 % (ref 0–1)
Basophils Absolute: 0 10*3/uL (ref 0.0–0.1)
EOS ABS: 0.2 10*3/uL (ref 0.0–0.7)
Eosinophils Relative: 3 % (ref 0–5)
HCT: 41.3 % (ref 36.0–46.0)
HEMOGLOBIN: 14.1 g/dL (ref 12.0–15.0)
Lymphocytes Relative: 30 % (ref 12–46)
Lymphs Abs: 2.1 10*3/uL (ref 0.7–4.0)
MCH: 30.4 pg (ref 26.0–34.0)
MCHC: 34.1 g/dL (ref 30.0–36.0)
MCV: 89 fL (ref 78.0–100.0)
Monocytes Absolute: 0.4 10*3/uL (ref 0.1–1.0)
Monocytes Relative: 6 % (ref 3–12)
NEUTROS PCT: 61 % (ref 43–77)
Neutro Abs: 4.2 10*3/uL (ref 1.7–7.7)
Platelets: 271 10*3/uL (ref 150–400)
RBC: 4.64 MIL/uL (ref 3.87–5.11)
RDW: 12.6 % (ref 11.5–15.5)
WBC: 6.9 10*3/uL (ref 4.0–10.5)

## 2014-10-23 MED ORDER — ONDANSETRON HCL 4 MG/2ML IJ SOLN
4.0000 mg | Freq: Once | INTRAMUSCULAR | Status: AC
Start: 2014-10-23 — End: 2014-10-23
  Administered 2014-10-23: 4 mg via INTRAVENOUS
  Filled 2014-10-23: qty 2

## 2014-10-23 MED ORDER — FAMOTIDINE IN NACL 20-0.9 MG/50ML-% IV SOLN
20.0000 mg | Freq: Once | INTRAVENOUS | Status: AC
  Administered 2014-10-23: 20 mg via INTRAVENOUS
  Filled 2014-10-23: qty 50

## 2014-10-23 MED ORDER — POTASSIUM CHLORIDE 10 MEQ/100ML IV SOLN
10.0000 meq | INTRAVENOUS | Status: AC
  Administered 2014-10-23: 10 meq via INTRAVENOUS
  Filled 2014-10-23: qty 100

## 2014-10-23 MED ORDER — PROMETHAZINE HCL 12.5 MG PO TABS: ORAL_TABLET | ORAL | Status: DC

## 2014-10-23 MED ORDER — METOCLOPRAMIDE HCL 5 MG/ML IJ SOLN
10.0000 mg | Freq: Once | INTRAMUSCULAR | Status: AC
  Administered 2014-10-23: 10 mg via INTRAVENOUS
  Filled 2014-10-23: qty 2

## 2014-10-23 MED ORDER — SODIUM CHLORIDE 0.9 % IV BOLUS (SEPSIS)
1000.0000 mL | Freq: Once | INTRAVENOUS | Status: AC
  Administered 2014-10-23: 1000 mL via INTRAVENOUS

## 2014-10-23 MED ORDER — DIPHENHYDRAMINE HCL 50 MG/ML IJ SOLN
12.5000 mg | Freq: Once | INTRAMUSCULAR | Status: AC
  Administered 2014-10-23: 12.5 mg via INTRAVENOUS
  Filled 2014-10-23: qty 1

## 2014-10-23 MED ORDER — POTASSIUM CHLORIDE CRYS ER 20 MEQ PO TBCR
30.0000 meq | EXTENDED_RELEASE_TABLET | Freq: Once | ORAL | Status: AC
  Administered 2014-10-23: 30 meq via ORAL
  Filled 2014-10-23 (×2): qty 1

## 2014-10-23 NOTE — Discharge Instructions (Signed)
Hypokalemia Hypokalemia means that the amount of potassium in the blood is lower than normal.Potassium is a chemical, called an electrolyte, that helps regulate the amount of fluid in the body. It also stimulates muscle contraction and helps nerves function properly.Most of the body's potassium is inside of cells, and only a very small amount is in the blood. Because the amount in the blood is so small, minor changes can be life-threatening. CAUSES  Antibiotics.  Diarrhea or vomiting.  Using laxatives too much, which can cause diarrhea.  Chronic kidney disease.  Water pills (diuretics).  Eating disorders (bulimia).  Low magnesium level.  Sweating a lot. SIGNS AND SYMPTOMS  Weakness.  Constipation.  Fatigue.  Muscle cramps.  Mental confusion.  Skipped heartbeats or irregular heartbeat (palpitations).  Tingling or numbness. DIAGNOSIS  Your health care provider can diagnose hypokalemia with blood tests. In addition to checking your potassium level, your health care provider may also check other lab tests. TREATMENT Hypokalemia can be treated with potassium supplements taken by mouth or adjustments in your current medicines. If your potassium level is very low, you may need to get potassium through a vein (IV) and be monitored in the hospital. A diet high in potassium is also helpful. Foods high in potassium are:  Nuts, such as peanuts and pistachios.  Seeds, such as sunflower seeds and pumpkin seeds.  Peas, lentils, and lima beans.  Whole grain and bran cereals and breads.  Fresh fruit and vegetables, such as apricots, avocado, bananas, cantaloupe, kiwi, oranges, tomatoes, asparagus, and potatoes.  Orange and tomato juices.  Red meats.  Fruit yogurt. HOME CARE INSTRUCTIONS  Take all medicines as prescribed by your health care provider.  Maintain a healthy diet by including nutritious food, such as fruits, vegetables, nuts, whole grains, and lean meats.  If  you are taking a laxative, be sure to follow the directions on the label. SEEK MEDICAL CARE IF:  Your weakness gets worse.  You feel your heart pounding or racing.  You are vomiting or having diarrhea.  You are diabetic and having trouble keeping your blood glucose in the normal range. SEEK IMMEDIATE MEDICAL CARE IF:  You have chest pain, shortness of breath, or dizziness.  You are vomiting or having diarrhea for more than 2 days.  You faint. MAKE SURE YOU:   Understand these instructions.  Will watch your condition.  Will get help right away if you are not doing well or get worse. Document Released: 10/06/2005 Document Revised: 07/27/2013 Document Reviewed: 04/08/2013 [facility] Patient Information 2015 [place], [place]. This information is not intended to replace advice given to you by your health care provider. Make sure you discuss any questions you have with your health care provider.  Nausea and Vomiting Nausea means you feel sick to your stomach. Throwing up (vomiting) is a reflex where stomach contents come out of your mouth. HOME CARE   Take medicine as told by your doctor.  Do not force yourself to eat. However, you do need to drink fluids.  If you feel like eating, eat a normal diet as told by your doctor.  Eat rice, wheat, potatoes, bread, lean meats, yogurt, fruits, and vegetables.  Avoid high-fat foods.  Drink enough fluids to keep your pee (urine) clear or pale yellow.  Ask your doctor how to replace body fluid losses (rehydrate). Signs of body fluid loss (dehydration) include:  Feeling very thirsty.  Dry lips and mouth.  Feeling dizzy.  Dark pee.  Peeing less than normal.  Feeling confused.  Fast breathing or heart rate. GET HELP RIGHT AWAY IF:   You have blood in your throw up.  You have black or bloody poop (stool).  You have a bad headache or stiff neck.  You feel confused.  You have bad belly (abdominal) pain.  You have chest pain  or trouble breathing.  You do not pee at least once every 8 hours.  You have cold, clammy skin.  You keep throwing up after 24 to 48 hours.  You have a fever. MAKE SURE YOU:   Understand these instructions.  Will watch your condition.  Will get help right away if you are not doing well or get worse. Document Released: 03/24/2008 Document Revised: 12/29/2011 Document Reviewed: 03/07/2011 [facility] Patient Information 2015 [place], [place]. This information is not intended to replace advice given to you by your health care provider. Make sure you discuss any questions you have with your health care provider. Call DR. Benson Norway today for follow up

## 2014-10-23 NOTE — ED Notes (Signed)
Pt arrived to the ED with a complaint of emesis.  Pt is being treated for C-diff.  Pt states that the medicine she takes for the C-diff causes her to have emesis.  Pt states she has eaten very little and drank very little.  Pt has lost count of how many times she has had episodes of emesis in the last 24 hours.  Pt has abdominal pain.

## 2014-10-23 NOTE — ED Notes (Signed)
Pt given a cup of water and tolerated w/o difficulty.  No emesis however pt still endorses nausea.

## 2014-10-23 NOTE — ED Provider Notes (Signed)
CSN: [id number]     Arrival date & time 10/23/14  0143 History   First MD Initiated Contact with Patient 10/23/14 0157     Chief Complaint  Patient presents with  . Emesis     (Consider location/radiation/quality/duration/timing/severity/associated sxs/prior Treatment) HPI Comments: Patient is currently being treated for C. difficile with Flagyl.  She states every time she takes the medication.  She gets abdominal cramping and nausea in the last 24 hours.  She's vomited so many times.  She's lost count.  She has been unable to eat or drink very much in the last 48 hours. Patient has chronic opiate user for chronic back pain, taking 4 Percocets a day.  She states this doesn't help her abdominal pain. She also states that she's stopped taking her PPI.  She was afraid it with interfere with the Flagyl use  Patient is a 59 y.o. female presenting with vomiting. The history is provided by the patient.  Emesis Severity:  Moderate Timing:  Intermittent Quality:  Bilious material Progression:  Worsening Chronicity:  New Relieved by:  None tried Worsened by:  Nothing tried Ineffective treatments:  None tried Associated symptoms: abdominal pain   Associated symptoms: no cough, no diarrhea and no fever   Abdominal pain:    Location:  Generalized   Severity:  Moderate   Onset quality:  Gradual   Timing:  Constant   Progression:  Unchanged   Chronicity:  New   Past Medical History  Diagnosis Date  . Hypertension   . Asthma   . High cholesterol   . Chronic headaches   . OSA (obstructive sleep apnea)   . Cervical cancer dx'd ~ 2005  . Constipation    Past Surgical History  Procedure Laterality Date  . Cholecystectomy    . Vaginal hysterectomy    . Tubal ligation    . Hemrr    . Hemorrhoid surgery     Family History  Problem Relation Age of Onset  . Lung cancer Mother   . Cancer Maternal Aunt   . Heart disease Maternal Grandfather   . Heart disease Maternal Grandmother   .  Heart disease Paternal Grandfather   . Heart disease Paternal Grandmother   . Asthma Son   . Asthma Father   . Asthma Maternal Uncle   . Allergies      everyone   History  Substance Use Topics  . Smoking status: Never Smoker   . Smokeless tobacco: Not on file  . Alcohol Use: No   OB History    No data available     Review of Systems  Gastrointestinal: Positive for vomiting and abdominal pain. Negative for diarrhea.      Allergies  Review of patient's allergies indicates no known allergies.  Home Medications   Prior to Admission medications   Medication Sig Start Date End Date Taking? Authorizing Provider  aspirin 81 MG tablet Take 81 mg by mouth daily.   Yes Historical Provider, MD  Calcium Carb-Cholecalciferol (CALCIUM PLUS VITAMIN D3 PO) Take 1 tablet by mouth 2 (two) times daily.    Yes Historical Provider, MD  cetirizine (ZYRTEC) 10 MG tablet Take 10 mg by mouth daily.   Yes Historical Provider, MD  docusate sodium (COLACE) 100 MG capsule Take 300 mg by mouth daily.   Yes Historical Provider, MD  famotidine (PEPCID) 20 MG tablet Take 20 mg by mouth daily.   Yes Historical Provider, MD  lactulose (CHRONULAC) 10 GM/15ML solution Take 30 g by  mouth 2 (two) times daily as needed for moderate constipation. Patient alternates with movantik for regularity   Yes Historical Provider, MD  metroNIDAZOLE (FLAGYL) 500 MG tablet Take 500 mg by mouth 3 (three) times daily.  10/18/14  Yes Historical Provider, MD  Multiple Vitamins-Minerals (MULTIVITAL PO) Take by mouth. Once a day   Yes Historical Provider, MD  Naloxegol Oxalate (MOVANTIK) 25 MG TABS Take 1 tablet by mouth daily as needed (constipation). Patient alternates with lactulose for regularity   Yes Historical Provider, MD  oxyCODONE (OXYCONTIN) 15 MG TB12 Take 15 mg by mouth 4 (four) times daily. 4 times a day   Yes Historical Provider, MD  pravastatin (PRAVACHOL) 40 MG tablet Take 40 mg by mouth daily.   Yes Historical  Provider, MD  valsartan-hydrochlorothiazide (DIOVAN-HCT) 160-12.5 MG per tablet Take 1 tablet by mouth daily.  10/19/14  Yes Historical Provider, MD  ciprofloxacin (CIPRO) 500 MG tablet Take 1 tablet (500 mg total) by mouth every 12 (twelve) hours. Patient not taking: Reported on 10/15/2014 06/27/14   April K Palumbo-Rasch, MD  ibuprofen (ADVIL,MOTRIN) 800 MG tablet Take 1 tablet (800 mg total) by mouth 3 (three) times daily. Patient not taking: Reported on 10/15/2014 06/27/14   April K Palumbo-Rasch, MD  phenazopyridine (PYRIDIUM) 200 MG tablet Take 1 tablet (200 mg total) by mouth 3 (three) times daily. Patient not taking: Reported on 10/15/2014 06/27/14   April K Palumbo-Rasch, MD  promethazine (PHENERGAN) 12.5 MG tablet 30 minutes prior to meal 10/23/14   Garald Balding, NP   BP 94/43 mmHg  Pulse 75  Temp(Src) 98.3 F (36.8 C) (Oral)  Resp 18  SpO2 96% Physical Exam  ED Course  Procedures (including critical care time) Labs Review Labs Reviewed  I-STAT CHEM 8, ED - Abnormal; Notable for the following:    Potassium 3.2 (*)    Calcium, Ion 1.09 (*)    All other components within normal limits  CBC WITH DIFFERENTIAL    Imaging Review No results found.   EKG Interpretation None      MDM  Pateint has received Zofran and Reglan for her nausea with some relief  Able to tolerate PO fluids was give PO and IV potassium  To follow up with Dr. Benson Norway today   Final diagnoses:  Emesis, persistent  Hypokalemia        Garald Balding, NP 10/23/14 0600  Everlene Balls, MD 10/23/14 1513

## 2016-12-05 ENCOUNTER — Telehealth: Payer: Self-pay | Admitting: Pulmonary Disease

## 2016-12-05 NOTE — Telephone Encounter (Signed)
Received request for records from Dr. Laverle Hobby office I faxed 15 pages to them on 11/27/2016

## 2019-02-12 ENCOUNTER — Telehealth: Payer: Self-pay | Admitting: Physician Assistant

## 2019-02-12 NOTE — Telephone Encounter (Signed)
Pt called.  Dr Carlean Purl and I are covering GI, Dr Benson Norway for the weekend.  Increased constipation started last week, unable to have BM and had intestinal distress.  No results after taking OTC magnesium supplement.   Her PMD prescribed Motegrity 4/23, and gave her a sample.  She took this on 4/23 Starting Friday she had diarrhea and cramping abdominal pain that continues.  Very weak.  Feels feverish, no sweats.  No nausea but has no desire to eat.   Remote hx of an ileus in 2006 (inpt record reviewed). She takes chronic narcotics for pain control.   Advised her to lay down, relax, apply heat, stick to clear liquids and, if in 2 hours still feels same sxs, she should go to [facility] or [facility] for eval.  If feels better, she should follow up with Dr Benson Norway on Monday.  I endorsed her decision to not take any additional Motegrity.    Azucena Freed PA-C

## 2019-02-14 ENCOUNTER — Ambulatory Visit
Admission: RE | Admit: 2019-02-14 | Discharge: 2019-02-14 | Disposition: A | Discharge status: Home / Self Care | Payer: Medicare Other | Source: Ambulatory Visit | Attending: Gastroenterology | Admitting: Gastroenterology

## 2019-02-14 ENCOUNTER — Other Ambulatory Visit: Payer: Self-pay

## 2019-02-14 ENCOUNTER — Other Ambulatory Visit: Payer: Self-pay | Admitting: Gastroenterology

## 2019-02-14 DIAGNOSIS — R109 Unspecified abdominal pain: Secondary | ICD-10-CM

## 2020-01-12 ENCOUNTER — Emergency Department (HOSPITAL_COMMUNITY): Payer: Medicare Other

## 2020-01-12 ENCOUNTER — Other Ambulatory Visit: Payer: Self-pay

## 2020-01-12 ENCOUNTER — Encounter (HOSPITAL_COMMUNITY): Payer: Self-pay | Admitting: Emergency Medicine

## 2020-01-12 ENCOUNTER — Emergency Department (HOSPITAL_COMMUNITY)
Admission: EM | Admit: 2020-01-12 | Discharge: 2020-01-12 | Disposition: A | Discharge status: Home / Self Care | Payer: Medicare Other | Attending: Emergency Medicine | Admitting: Emergency Medicine

## 2020-01-12 DIAGNOSIS — I10 Essential (primary) hypertension: Secondary | ICD-10-CM | POA: Diagnosis not present

## 2020-01-12 DIAGNOSIS — B349 Viral infection, unspecified: Secondary | ICD-10-CM | POA: Diagnosis not present

## 2020-01-12 DIAGNOSIS — R519 Headache, unspecified: Secondary | ICD-10-CM | POA: Insufficient documentation

## 2020-01-12 DIAGNOSIS — R1084 Generalized abdominal pain: Secondary | ICD-10-CM

## 2020-01-12 DIAGNOSIS — R110 Nausea: Secondary | ICD-10-CM | POA: Diagnosis present

## 2020-01-12 DIAGNOSIS — Z9049 Acquired absence of other specified parts of digestive tract: Secondary | ICD-10-CM | POA: Insufficient documentation

## 2020-01-12 DIAGNOSIS — J45909 Unspecified asthma, uncomplicated: Secondary | ICD-10-CM | POA: Insufficient documentation

## 2020-01-12 DIAGNOSIS — Z20822 Contact with and (suspected) exposure to covid-19: Secondary | ICD-10-CM | POA: Diagnosis not present

## 2020-01-12 DIAGNOSIS — Z7982 Long term (current) use of aspirin: Secondary | ICD-10-CM | POA: Diagnosis not present

## 2020-01-12 DIAGNOSIS — Z8541 Personal history of malignant neoplasm of cervix uteri: Secondary | ICD-10-CM | POA: Insufficient documentation

## 2020-01-12 DIAGNOSIS — Z79899 Other long term (current) drug therapy: Secondary | ICD-10-CM | POA: Diagnosis not present

## 2020-01-12 LAB — CBC WITH DIFFERENTIAL/PLATELET
Abs Immature Granulocytes: 0.02 10*3/uL (ref 0.00–0.07)
Basophils Absolute: 0 10*3/uL (ref 0.0–0.1)
Basophils Relative: 1 %
Eosinophils Absolute: 0 10*3/uL (ref 0.0–0.5)
Eosinophils Relative: 0 %
HCT: 39.9 % (ref 36.0–46.0)
Hemoglobin: 13.7 g/dL (ref 12.0–15.0)
Immature Granulocytes: 0 %
Lymphocytes Relative: 10 %
Lymphs Abs: 0.7 10*3/uL (ref 0.7–4.0)
MCH: 31.3 pg (ref 26.0–34.0)
MCHC: 34.3 g/dL (ref 30.0–36.0)
MCV: 91.1 fL (ref 80.0–100.0)
Monocytes Absolute: 0.3 10*3/uL (ref 0.1–1.0)
Monocytes Relative: 4 %
Neutro Abs: 6.2 10*3/uL (ref 1.7–7.7)
Neutrophils Relative %: 85 %
Platelets: 266 10*3/uL (ref 150–400)
RBC: 4.38 MIL/uL (ref 3.87–5.11)
RDW: 12.3 % (ref 11.5–15.5)
WBC: 7.3 10*3/uL (ref 4.0–10.5)
nRBC: 0 % (ref 0.0–0.2)

## 2020-01-12 LAB — COMPREHENSIVE METABOLIC PANEL
ALT: 18 U/L (ref 0–44)
AST: 27 U/L (ref 15–41)
Albumin: 4.2 g/dL (ref 3.5–5.0)
Alkaline Phosphatase: 86 U/L (ref 38–126)
Anion gap: 12 (ref 5–15)
BUN: 12 mg/dL (ref 8–23)
CO2: 25 mmol/L (ref 22–32)
Calcium: 9.6 mg/dL (ref 8.9–10.3)
Chloride: 98 mmol/L (ref 98–111)
Creatinine, Ser: 0.77 mg/dL (ref 0.44–1.00)
GFR calc Af Amer: >60 mL/min (ref >60)
GFR calc non Af Amer: >60 mL/min (ref >60)
Glucose, Bld: 107 mg/dL — ABNORMAL HIGH (ref 70–99)
Potassium: 3.8 mmol/L (ref 3.5–5.1)
Sodium: 135 mmol/L (ref 135–145)
Total Bilirubin: 1.2 mg/dL (ref 0.3–1.2)
Total Protein: 7.5 g/dL (ref 6.5–8.1)

## 2020-01-12 LAB — SARS CORONAVIRUS 2 (TAT 6-24 HRS): SARS Coronavirus 2: NEGATIVE

## 2020-01-12 LAB — LIPASE, BLOOD: Lipase: 22 U/L (ref 11–51)

## 2020-01-12 MED ORDER — ONDANSETRON HCL 4 MG/2ML IJ SOLN
4.0000 mg | Freq: Once | INTRAMUSCULAR | Status: AC
  Administered 2020-01-12: 4 mg via INTRAVENOUS
  Filled 2020-01-12: qty 2

## 2020-01-12 MED ORDER — SODIUM CHLORIDE (PF) 0.9 % IJ SOLN
INTRAMUSCULAR | Status: AC
  Filled 2020-01-12: qty 50

## 2020-01-12 MED ORDER — DIPHENHYDRAMINE HCL 50 MG/ML IJ SOLN
25.0000 mg | Freq: Once | INTRAMUSCULAR | Status: AC
  Administered 2020-01-12: 25 mg via INTRAVENOUS
  Filled 2020-01-12: qty 1

## 2020-01-12 MED ORDER — ONDANSETRON 4 MG PO TBDP: 4.0000 mg | ORAL_TABLET | Freq: Three times a day (TID) | ORAL | 0 refills | Status: DC | PRN

## 2020-01-12 MED ORDER — SODIUM CHLORIDE 0.9 % IV BOLUS
1000.0000 mL | Freq: Once | INTRAVENOUS | Status: AC
  Administered 2020-01-12: 1000 mL via INTRAVENOUS

## 2020-01-12 MED ORDER — PROCHLORPERAZINE EDISYLATE 10 MG/2ML IJ SOLN
10.0000 mg | Freq: Once | INTRAMUSCULAR | Status: AC
  Administered 2020-01-12: 10 mg via INTRAVENOUS
  Filled 2020-01-12: qty 2

## 2020-01-12 MED ORDER — IOHEXOL 350 MG/ML SOLN
100.0000 mL | Freq: Once | INTRAVENOUS | Status: AC | PRN
  Administered 2020-01-12: 100 mL via INTRAVENOUS

## 2020-01-12 MED ORDER — FENTANYL CITRATE (PF) 100 MCG/2ML IJ SOLN
50.0000 ug | Freq: Once | INTRAMUSCULAR | Status: AC
  Administered 2020-01-12: 50 ug via INTRAVENOUS
  Filled 2020-01-12: qty 2

## 2020-01-12 NOTE — ED Provider Notes (Signed)
Blood pressure (!) 124/58, pulse 64, temperature 99 F (37.2 C), temperature source Oral, resp. rate 19, SpO2 100 %.  Assuming care from Dr. Roslynn Amble.  In short, Cassandra Kelley is a 64 y.o. female with a chief complaint of Nausea and Headache .  Refer to the original H&P for additional details.  The current plan of care is to f/u on CT scans and reassess.  05:39 PM  CT imaging of the head, abdomen, pelvis reviewed.  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  Discussed this narrowing with her and I will have her follow with a neurologist both for this and for her headache symptoms, especially if they continue and/or worsen.  CT abdomen pelvis with no acute findings and baseline biliary dilatation expected in the postop setting.  Patient is feeling improved here.  Plan for discharge home with nausea medication and close PCP follow-up.  She will follow the COVID-19 results on her phone in the [place] app.     Margette Fast, MD 01/12/20 [phone]

## 2020-01-12 NOTE — ED Provider Notes (Signed)
[facility] DEPT Provider Note   CSN: [id number] Arrival date & time: 01/12/20  1116     History Chief Complaint  Patient presents with  . Nausea  . Headache    Cassandra Kelley is a 64 y.o. female.  Presents to emergency department with chief complaint nausea, headache.  Reports that the past week she feels that she has been sick.  Initially started with some mild nausea but that has gotten progressively worse, now thinks nausea is severe, has had a couple episodes of vomiting, nonbloody nonbilious.  Headache started a couple days into the illness, has steadily been worsening.  Now worst headache of her life.  Not sudden onset.  Primarily frontal, achy, severe.  No associated fever.  Has had increased fatigue.  No cough.  Has had some generalized abdominal discomfort, denies any pain at this time.  No chest pain or difficulty in breathing.  HPI     Past Medical History:  Diagnosis Date  . Asthma   . Cervical cancer ([place]) dx'd ~ 2005  . Chronic headaches   . Constipation   . High cholesterol   . Hypertension   . OSA (obstructive sleep apnea)     Patient Active Problem List   Diagnosis Date Noted  . OSA (obstructive sleep apnea) 04/05/2012    Past Surgical History:  Procedure Laterality Date  . CHOLECYSTECTOMY    . HEMORRHOID SURGERY    . hemrr    . TUBAL LIGATION    . VAGINAL HYSTERECTOMY       OB History   No obstetric history on file.     Family History  Problem Relation Age of Onset  . Lung cancer Mother   . Cancer Maternal Aunt   . Heart disease Maternal Grandfather   . Heart disease Maternal Grandmother   . Heart disease Paternal Grandfather   . Heart disease Paternal Grandmother   . Asthma Son   . Asthma Father   . Asthma Maternal Uncle   . Allergies Other        everyone    Social History   Tobacco Use  . Smoking status: Never Smoker  Substance Use Topics  . Alcohol use: No  . Drug use: No    Home  Medications Prior to Admission medications   Medication Sig Start Date End Date Taking? Authorizing Provider  aspirin 81 MG tablet Take 81 mg by mouth daily.    [provider]  Calcium Carb-Cholecalciferol (CALCIUM PLUS VITAMIN D3 PO) Take 1 tablet by mouth 2 (two) times daily.     [provider]  cetirizine (ZYRTEC) 10 MG tablet Take 10 mg by mouth daily.    [provider]  ciprofloxacin (CIPRO) 500 MG tablet Take 1 tablet (500 mg total) by mouth every 12 (twelve) hours. Patient not taking: Reported on 10/15/2014 06/27/14   Palumbo, April, MD  docusate sodium (COLACE) 100 MG capsule Take 300 mg by mouth daily.    [provider]  famotidine (PEPCID) 20 MG tablet Take 20 mg by mouth daily.    [provider]  ibuprofen (ADVIL,MOTRIN) 800 MG tablet Take 1 tablet (800 mg total) by mouth 3 (three) times daily. Patient not taking: Reported on 10/15/2014 06/27/14   Palumbo, April, MD  lactulose (CHRONULAC) 10 GM/15ML solution Take 30 g by mouth 2 (two) times daily as needed for moderate constipation. Patient alternates with movantik for regularity    [provider]  metroNIDAZOLE (FLAGYL) 500 MG  tablet Take 500 mg by mouth 3 (three) times daily.  10/18/14   [provider]  Multiple Vitamins-Minerals (MULTIVITAL PO) Take by mouth. Once a day    [provider]  Naloxegol Oxalate (MOVANTIK) 25 MG TABS Take 1 tablet by mouth daily as needed (constipation). Patient alternates with lactulose for regularity    [provider]  oxyCODONE (OXYCONTIN) 15 MG TB12 Take 15 mg by mouth 4 (four) times daily. 4 times a day    [provider]  phenazopyridine (PYRIDIUM) 200 MG tablet Take 1 tablet (200 mg total) by mouth 3 (three) times daily. Patient not taking: Reported on 10/15/2014 06/27/14   Palumbo, April, MD  pravastatin (PRAVACHOL) 40 MG tablet Take 40 mg by mouth daily.    [provider]  promethazine  (PHENERGAN) 12.5 MG tablet 30 minutes prior to meal 10/23/14   Junius Creamer, NP  valsartan-hydrochlorothiazide (DIOVAN-HCT) 160-12.5 MG per tablet Take 1 tablet by mouth daily.  10/19/14   [provider]    Allergies    Patient has no known allergies.  Review of Systems   Review of Systems  Constitutional: Negative for chills and fever.  HENT: Negative for ear pain and sore throat.   Eyes: Negative for pain and visual disturbance.  Respiratory: Negative for cough and shortness of breath.   Cardiovascular: Negative for chest pain and palpitations.  Gastrointestinal: Positive for abdominal pain, nausea and vomiting.  Genitourinary: Negative for dysuria and hematuria.  Musculoskeletal: Negative for arthralgias and back pain.  Skin: Negative for color change and rash.  Neurological: Positive for headaches. Negative for seizures and syncope.  All other systems reviewed and are negative.   Physical Exam Updated Vital Signs BP (!) 124/58   Pulse 64   Temp 99 F (37.2 C) (Oral)   Resp 19   SpO2 100%   Physical Exam Vitals and nursing note reviewed.  Constitutional:      General: She is not in acute distress.    Appearance: She is well-developed.  HENT:     Head: Normocephalic and atraumatic.     Mouth/Throat:     Mouth: Mucous membranes are moist.  Eyes:     General: No visual field deficit.    Conjunctiva/sclera: Conjunctivae normal.  Cardiovascular:     Rate and Rhythm: Normal rate and regular rhythm.     Heart sounds: No murmur.  Pulmonary:     Effort: Pulmonary effort is normal. No respiratory distress.     Breath sounds: Normal breath sounds.  Abdominal:     Palpations: Abdomen is soft.     Tenderness: There is no abdominal tenderness.  Musculoskeletal:        General: No swelling or tenderness.     Cervical back: Neck supple.  Skin:    General: Skin is warm and dry.     Capillary Refill: Capillary refill takes less than 2 seconds.  Neurological:      Mental Status: She is alert and oriented to person, place, and time.     GCS: GCS eye subscore is 4. GCS verbal subscore is 5. GCS motor subscore is 6.     Cranial Nerves: No cranial nerve deficit, dysarthria or facial asymmetry.  Psychiatric:        Mood and Affect: Mood normal.        Behavior: Behavior normal.     ED Results / Procedures / Treatments   Labs (all labs ordered are listed, but only abnormal results are displayed)  Labs Reviewed  COMPREHENSIVE METABOLIC PANEL - Abnormal; Notable for the following components:      Result Value   Glucose, Bld 107 (*)    All other components within normal limits  SARS CORONAVIRUS 2 (TAT 6-24 HRS)  CBC WITH DIFFERENTIAL/PLATELET  LIPASE, BLOOD    EKG EKG Interpretation  Date/Time:  Thursday January 12 2020 13:40:01 EDT Ventricular Rate:  63 PR Interval:    QRS Duration: 100 QT Interval:  458 QTC Calculation: 469 R Axis:   -9 Text Interpretation: Sinus rhythm Low voltage, precordial leads RSR' in V1 or V2, right VCD or RVH No acute changes Confirmed by Madalyn Rob [phone]) on 01/12/2020 1:51:42 PM   Radiology DG Chest Portable 1 View  Result Date: 01/12/2020 CLINICAL DATA:  Shortness of breath and fever. EXAM: PORTABLE CHEST 1 VIEW COMPARISON:  November 22, 2009 FINDINGS: The heart size and mediastinal contours are within normal limits. Both lungs are clear. The visualized skeletal structures are unremarkable. IMPRESSION: No active disease. Electronically Signed   By: Abelardo Diesel M.D.   On: 01/12/2020 13:26    Procedures Procedures (including critical care time)  Medications Ordered in ED Medications  ondansetron (ZOFRAN) injection 4 mg (has no administration in time range)  fentaNYL (SUBLIMAZE) injection 50 mcg (has no administration in time range)  sodium chloride (PF) 0.9 % injection (has no administration in time range)  prochlorperazine (COMPAZINE) injection 10 mg (10 mg Intravenous Given 01/12/20 1346)   diphenhydrAMINE (BENADRYL) injection 25 mg (25 mg Intravenous Given 01/12/20 1344)  sodium chloride 0.9 % bolus 1,000 mL (1,000 mLs Intravenous New Bag/Given (Non-Interop) 01/12/20 1341)  iohexol (OMNIPAQUE) 350 MG/ML injection 100 mL (100 mLs Intravenous Contrast Given 01/12/20 1630)    ED Course  I have reviewed the triage vital signs and the nursing notes.  Pertinent labs & imaging results that were available during my care of the patient were reviewed by me and considered in my medical decision making (see chart for details).    MDM Rules/Calculators/A&P                      64 year old lady presenting to ER with myriad symptoms including nausea, vomiting, abdominal pain, headache.  On exam she is well-appearing with stable vital signs.  Suspect symptoms most likely viral in etiology.  Given she had some tenderness in abdomen and headache was reportedly worst headache of her life, obtain CT imaging to further evaluate.  While awaiting CT scans, reassessment, patient signed out to Dr. Laverta Baltimore.  Anticipated symptoms controlled, CT is negative, patient can be discharged home for outpatient management.  Final Clinical Impression(s) / ED Diagnoses Final diagnoses:  Viral syndrome    Rx / DC Orders ED Discharge Orders    None       Lucrezia Starch, MD 01/12/20 [phone]

## 2020-01-12 NOTE — Discharge Instructions (Signed)
You were seen in the emergency department today with headache and abdominal pain.  Your lab work and CT scans look normal.  You do have some narrowing in one of the arteries in your brain but the narrowing is only moderate.  I have attached the name of a neurologist to this discharge paperwork.  Please call to schedule a follow-up appointment.  Your Covid test will come back in the MyChart app in the next 24 hours.  Please remain in isolation until the results come back.  I am sending you home with Zofran to take as needed for nausea.  You can take Tylenol for pain and get plenty of rest and oral fluids.

## 2020-01-12 NOTE — ED Triage Notes (Signed)
Pt reports that she ben nauseated since last week sometime. Reports that saw orthopedic last week and was put on medications that made her sleep. Pt c/o headaches that have been ongoing for over a week.

## 2020-01-17 ENCOUNTER — Ambulatory Visit: Payer: Medicare Other | Admitting: Diagnostic Neuroimaging

## 2020-01-24 ENCOUNTER — Encounter: Payer: Self-pay | Admitting: Neurology

## 2020-01-24 ENCOUNTER — Ambulatory Visit (INDEPENDENT_AMBULATORY_CARE_PROVIDER_SITE_OTHER): Payer: Medicare Other | Admitting: Neurology

## 2020-01-24 ENCOUNTER — Other Ambulatory Visit: Payer: Self-pay

## 2020-01-24 VITALS — BP 126/86 | HR 77 | Ht 59.0 in | Wt 230.0 lb

## 2020-01-24 DIAGNOSIS — B349 Viral infection, unspecified: Secondary | ICD-10-CM | POA: Diagnosis not present

## 2020-01-24 DIAGNOSIS — I669 Occlusion and stenosis of unspecified cerebral artery: Secondary | ICD-10-CM | POA: Diagnosis not present

## 2020-01-24 NOTE — Progress Notes (Signed)
Subjective:    Patient ID: Cassandra Kelley is a 64 y.o. female.  HPI     Star Age, MD, PhD [facility] [address] [place], [place] [address]  I saw patient, Cassandra Kelley, as a referral from the Emergency room for evaluation of her headaches.  The patient is unaccompanied today.  She is a 65 year old right-handed woman with an underlying medical history of hypertension, hyperlipidemia, cervical cancer, asthma, obstructive sleep apnea, and morbid obesity with a BMI of over 45, who presented to the emergency room on 01/12/2020 with headaches and abdominal pain as well as nausea.  I reviewed the emergency room records. She had a CT angiogram of the head with and without contrast on 01/12/2020 and I reviewed the results: IMPRESSION: 1. 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   2. No acute intracranial abnormality. Chronic left caudate lacunar infarct. She had an abdominal CT with contrast on 01/12/2020 and I reviewed the results: IMPRESSION: 1. Normal appendix. No explanation for right lower quadrant pain. 2. Postcholecystectomy with intra and mild extrahepatic biliary ductal dilatation. This may be normal for this patient, and appears similar to 2015 exam allowing for differences in technique. No further evaluation is recommended given normal LFTs. 3. Mild wall thickening of the distal esophagus, can be seen with reflux or esophagitis. 4. Fat containing umbilical and lower ventral abdominal wall hernias. Her Covid test was negative.   She reports feeling better.  She started feeling better after about a week with regards to her abdominal pain, nausea and headaches.  She has sleep apnea and has seen her dentist for this, she has an oral appliance but admits that she has not been using it.  She would be willing to get on track with treatment for sleep apnea again.  She has significant mobility  issues, she has significant knee pain, left side worse than right and is followed by Dr. Nelva Bush for this. She has been referred to home health physical therapy.  She walks with a cane.  She tries to hydrate well but admits that she could do better.  She denies any significant recent residual headaches.  She denies any sudden onset of one-sided weakness or numbness or tingling or droopy face or slurring of speech.  Her Past Medical History Is Significant For: Past Medical History:  Diagnosis Date  . Asthma   . Cervical cancer ([place]) dx'd ~ 2005  . Chronic headaches   . Constipation   . High cholesterol   . Hypertension   . OSA (obstructive sleep apnea)     Her Past Surgical History Is Significant For: Past Surgical History:  Procedure Laterality Date  . CHOLECYSTECTOMY    . HEMORRHOID SURGERY    . hemrr    . TUBAL LIGATION    . VAGINAL HYSTERECTOMY      Her Family History Is Significant For: Family History  Problem Relation Age of Onset  . Lung cancer Mother   . Cancer Maternal Aunt   . Heart disease Maternal Grandfather   . Heart disease Maternal Grandmother   . Heart disease Paternal Grandfather   . Heart disease Paternal Grandmother   . Asthma Son   . Asthma Father   . Asthma Maternal Uncle   . Allergies Other        everyone    Her Social History Is Significant For: Social History   Socioeconomic History  . Marital status: Married  Spouse name: Not on file  . Number of children: 2  . Years of education: `  . Highest education level: Not on file  Occupational History  . Occupation: retired/disabled  Tobacco Use  . Smoking status: Never Smoker  . Smokeless tobacco: Never Used  Substance and Sexual Activity  . Alcohol use: No  . Drug use: No  . Sexual activity: Not on file  Other Topics Concern  . Not on file  Social History Narrative  . Not on file   Social Determinants of Health   Financial Resource Strain:   . Difficulty of Paying Living Expenses:    Food Insecurity:   . Worried About Charity fundraiser in the Last Year:   . Arboriculturist in the Last Year:   Transportation Needs:   . Film/video editor (Medical):   Marland Kitchen Lack of Transportation (Non-Medical):   Physical Activity:   . Days of Exercise per Week:   . Minutes of Exercise per Session:   Stress:   . Feeling of Stress :   Social Connections:   . Frequency of Communication with Friends and Family:   . Frequency of Social Gatherings with Friends and Family:   . Attends Religious Services:   . Active Member of Clubs or Organizations:   . Attends Archivist Meetings:   Marland Kitchen Marital Status:     Her Allergies Are:  No Known Allergies:   Her Current Medications Are:  Outpatient Encounter Medications as of 01/24/2020  Medication Sig  . aspirin 81 MG tablet Take 81 mg by mouth daily.  . Calcium Carb-Cholecalciferol (CALCIUM PLUS VITAMIN D3 PO) Take 1 tablet by mouth 2 (two) times daily.   . cetirizine (ZYRTEC) 10 MG tablet Take 10 mg by mouth daily.  Marland Kitchen docusate sodium (COLACE) 100 MG capsule Take 300 mg by mouth daily.  Marland Kitchen gabapentin (NEURONTIN) 300 MG capsule Take 300 mg by mouth daily.  . Multiple Vitamins-Minerals (MULTIVITAL PO) Take by mouth. Once a day  . omeprazole (PRILOSEC) 20 MG capsule Take 20 mg by mouth daily.  Marland Kitchen oxyCODONE (OXYCONTIN) 15 MG TB12 Take 15 mg by mouth 4 (four) times daily. 4 times a day  . polyethylene glycol (MIRALAX / GLYCOLAX) 17 g packet Take 17 g by mouth daily.  . pravastatin (PRAVACHOL) 40 MG tablet Take 40 mg by mouth daily.  Marland Kitchen telmisartan-hydrochlorothiazide (MICARDIS HCT) 80-12.5 MG tablet Take 1 tablet by mouth daily.  . [DISCONTINUED] ciprofloxacin (CIPRO) 500 MG tablet Take 1 tablet (500 mg total) by mouth every 12 (twelve) hours. (Patient not taking: Reported on 10/15/2014)  . [DISCONTINUED] famotidine (PEPCID) 20 MG tablet Take 20 mg by mouth daily.  . [DISCONTINUED] ibuprofen (ADVIL,MOTRIN) 800 MG tablet Take 1 tablet  (800 mg total) by mouth 3 (three) times daily. (Patient not taking: Reported on 10/15/2014)  . [DISCONTINUED] lactulose (CHRONULAC) 10 GM/15ML solution Take 30 g by mouth 2 (two) times daily as needed for moderate constipation. Patient alternates with movantik for regularity  . [DISCONTINUED] metroNIDAZOLE (FLAGYL) 500 MG tablet Take 500 mg by mouth 3 (three) times daily.   . [DISCONTINUED] Naloxegol Oxalate (MOVANTIK) 25 MG TABS Take 1 tablet by mouth daily as needed (constipation). Patient alternates with lactulose for regularity  . [DISCONTINUED] ondansetron (ZOFRAN ODT) 4 MG disintegrating tablet Take 1 tablet (4 mg total) by mouth every 8 (eight) hours as needed for nausea or vomiting.  . [DISCONTINUED] phenazopyridine (PYRIDIUM) 200 MG tablet Take 1 tablet (200 mg  total) by mouth 3 (three) times daily. (Patient not taking: Reported on 10/15/2014)  . [DISCONTINUED] promethazine (PHENERGAN) 12.5 MG tablet 30 minutes prior to meal  . [DISCONTINUED] valsartan-hydrochlorothiazide (DIOVAN-HCT) 160-12.5 MG per tablet Take 1 tablet by mouth daily.    No facility-administered encounter medications on file as of 01/24/2020.  :   Review of Systems:  Out of a complete 14 point review of systems, all are reviewed and negative with the exception of these symptoms as listed below:  Review of Systems  Neurological:       Pt presents today to discuss her recent CT results. Pt reports that her headaches are ok right now. She was told that the CT head showed that she has a narrowed vessel in her brain that increases her stroke risk. Pt reports that she has had a sleep study in the past few years, has a cpap and oral appliance at home, is not using her cpap.  Epworth Sleepiness Scale 0= would never doze 1= slight chance of dozing 2= moderate chance of dozing 3= high chance of dozing  Sitting and reading: 0 Watching TV: 1 Sitting inactive in a public place (ex. Theater or meeting): 0 As a passenger in a  car for an hour without a break: 0 Lying down to rest in the afternoon: 1 Sitting and talking to someone: 0 Sitting quietly after lunch (no alcohol): 0 In a car, while stopped in traffic: 0 Total: 2     Objective:  Neurological Exam  Physical Exam Physical Examination:   Vitals:   01/24/20 1359  BP: 126/86  Pulse: 77    General Examination: The patient is a very pleasant 63 y.o. female in no acute distress. She appears well-developed and well-nourished and well groomed.   HEENT: Normocephalic, atraumatic, pupils are equal, round and reactive to light and accommodation. Funduscopic exam is normal with sharp disc margins noted. Extraocular tracking is good without limitation to gaze excursion or nystagmus noted. Normal smooth pursuit is noted. Hearing is grossly intact. Face is symmetric with normal facial animation and normal facial sensation. Speech is clear with no dysarthria noted. There is no hypophonia. There is no lip, neck/head, jaw or voice tremor. Neck is supple with full range of passive and active motion. There are no carotid bruits on auscultation. Oropharynx exam reveals: moderate mouth dryness, adequate dental hygiene. Tongue protrudes centrally and palate elevates symmetrically.   Chest: Clear to auscultation without wheezing, rhonchi or crackles noted.  Heart: S1+S2+0, regular and normal without murmurs, rubs or gallops noted.   Abdomen: Soft, non-tender and non-distended with normal bowel sounds appreciated on auscultation.  Extremities: There is no pitting edema in the distal lower extremities bilaterally. Pedal pulses are intact.  Skin: Warm and dry without trophic changes noted.  Musculoskeletal: exam reveals bilateral knee pain and decreased ROM L knee.   Neurologically:  Mental status: The patient is awake, alert and oriented in all 4 spheres. Her immediate and remote memory, attention, language skills and fund of knowledge are appropriate. There is no  evidence of aphasia, agnosia, apraxia or anomia. Speech is clear with normal prosody and enunciation. Thought process is linear. Mood is normal and affect is normal.  Cranial nerves II - XII are as described above under HEENT exam. In addition: shoulder shrug is normal with equal shoulder height noted. Motor exam: Normal bulk, strength and tone is noted. There is no drift, tremor or rebound. Romberg is not tested d/t safety concerns. Reflexes are 1+ in  the UEs and diminished in the LEs. Babinski: Toes are flexor bilaterally. Fine motor skills and coordination: intact with normal finger taps, normal hand movements, normal rapid alternating patting, normal foot taps and normal foot agility.  Cerebellar testing: No dysmetria or intention tremor on finger to nose testing. Heel to shin is not doable. There is no truncal or gait ataxia.  Sensory exam: intact to light touch in the upper and lower extremities.  Gait, station and balance: She stands with difficulty.  She has to push herself up.  She may have scoliosis.  She walks with a limp on the left, she has a single-point cane.              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  She had a CT angiogram of the head in the emergency room.  She was felt to have a viral syndrome as she presented with acute onset of abdominal pain, nausea, and recurrent headaches.  She has improved, she has a nonfocal neurological exam with chronic and stable findings particularly with regards to her orthopedic issues.  We talked about her vascular risk factors today.  She is encouraged to continue to work on weight loss, stay better hydrated with water and  get back on track with regards to treating her obstructive sleep apnea.  She is willing to talk to her dentist again.  She has an oral appliance.  In addition, I suggested we seek consultation with interventional radiology for her intracranial stenosis.  I would also like to go ahead with a carotid Doppler ultrasound to see if she has any extracranial stenosis of the major neck arteries.  She is agreeable to pursuing this and we will call her with the results.  I placed a referral to neuro interventional radiology, Dr. Estanislado Pandy as well. I suggested as needed follow up. I answered all her questions today and the patient was in agreement.  Star Age, MD, PhD

## 2020-01-24 NOTE — Patient Instructions (Signed)
I am glad to hear, that your symptoms have improved, particularly your nausea and headaches.  Your neurological exam does not suggest any strokelike findings, I would not recommend any new medications.  Nevertheless, since you were found to have a stenosis of one of your blood vessels in the brain which is a narrowing of one of the arteries in the brain, I would recommend evaluation through an interventional radiologist to see if any procedure would be indicated.  In addition, I would like for you to have an ultrasound of the neck arteries.  I will order a so-called carotid Doppler test.  We will call you with the results.  As discussed, prevention for stroke includes management of chronic risk factors including cholesterol, blood pressure management, obesity management and sleep apnea management.  Please continue to work on weight loss.  Please follow-up with your dentist regarding your sleep apnea management.  Please hydrate better with water, try to drink about 3 bottles of water per day on average. I can see you back as needed.

## 2020-01-25 ENCOUNTER — Other Ambulatory Visit (HOSPITAL_COMMUNITY): Payer: Self-pay | Admitting: Interventional Radiology

## 2020-01-25 DIAGNOSIS — I771 Stricture of artery: Secondary | ICD-10-CM

## 2020-02-06 ENCOUNTER — Other Ambulatory Visit: Payer: Self-pay

## 2020-02-06 ENCOUNTER — Ambulatory Visit (HOSPITAL_COMMUNITY)
Admission: RE | Admit: 2020-02-06 | Discharge: 2020-02-06 | Disposition: A | Discharge status: Home / Self Care | Payer: Medicare Other | Source: Ambulatory Visit | Attending: Interventional Radiology | Admitting: Interventional Radiology

## 2020-02-06 DIAGNOSIS — I771 Stricture of artery: Secondary | ICD-10-CM

## 2020-02-06 NOTE — H&P (Signed)
Chief Complaint: Patient was seen in consultation today for  at the request of Dr. Star Age  Referring Physician(s): Dr. Star Age  Supervising Physician: Luanne Bras  Patient Status: [facility] - Out-pt  History of Present Illness: Cassandra Kelley is a 64 y.o. female who had been having headaches as well as N/V sxs for a few weeks. She ultimately wen to the ER and a CT angio Head was performed and found right P1 segment stenosis. She denies recent visual changes. No other associated TIA or CVA type sxs. She was referred to outpt Neurology and is now here for further evaluation. She is obese and has hx HTN, DDD and scoliosis and states she gets around with use of a cane. She is also followed by Dr. Nelva Bush for pain control. PMHx, meds, labs, imaging, allergies reviewed. CTA reviewed with and by Dr. Tiburcio Pea well otherwise, no recent fevers, chills, illness.    Past Medical History:  Diagnosis Date  . Asthma   . Cervical cancer ([place]) dx'd ~ 2005  . Chronic headaches   . Constipation   . High cholesterol   . Hypertension   . OSA (obstructive sleep apnea)     Past Surgical History:  Procedure Laterality Date  . CHOLECYSTECTOMY    . HEMORRHOID SURGERY    . hemrr    . TUBAL LIGATION    . VAGINAL HYSTERECTOMY      Allergies: Patient has no known allergies.  Medications: Prior to Admission medications   Medication Sig Start Date End Date Taking? Authorizing Provider  aspirin 81 MG tablet Take 81 mg by mouth daily.    [provider]  Calcium Carb-Cholecalciferol (CALCIUM PLUS VITAMIN D3 PO) Take 1 tablet by mouth 2 (two) times daily.     [provider]  cetirizine (ZYRTEC) 10 MG tablet Take 10 mg by mouth daily.    [provider]  docusate sodium (COLACE) 100 MG capsule Take 300 mg by mouth daily.    [provider]  gabapentin (NEURONTIN) 300 MG capsule Take 300 mg by mouth daily.    [provider]   Multiple Vitamins-Minerals (MULTIVITAL PO) Take by mouth. Once a day    [provider]  omeprazole (PRILOSEC) 20 MG capsule Take 20 mg by mouth daily.    [provider]  oxyCODONE (OXYCONTIN) 15 MG TB12 Take 15 mg by mouth 4 (four) times daily. 4 times a day    [provider]  polyethylene glycol (MIRALAX / GLYCOLAX) 17 g packet Take 17 g by mouth daily.    [provider]  pravastatin (PRAVACHOL) 40 MG tablet Take 40 mg by mouth daily.    [provider]  telmisartan-hydrochlorothiazide (MICARDIS HCT) 80-12.5 MG tablet Take 1 tablet by mouth daily.    [provider]     Family History  Problem Relation Age of Onset  . Lung cancer Mother   . Cancer Maternal Aunt   . Heart disease Maternal Grandfather   . Heart disease Maternal Grandmother   . Heart disease Paternal Grandfather   . Heart disease Paternal Grandmother   . Asthma Son   . Asthma Father   . Asthma Maternal Uncle   . Allergies Other        everyone    Social History   Socioeconomic History  . Marital status: Married    Spouse name: Not on file  . Number of children: 2  . Years of education: `  . Highest  education level: Not on file  Occupational History  . Occupation: retired/disabled  Tobacco Use  . Smoking status: Never Smoker  . Smokeless tobacco: Never Used  Substance and Sexual Activity  . Alcohol use: No  . Drug use: No  . Sexual activity: Not on file  Other Topics Concern  . Not on file  Social History Narrative  . Not on file   Social Determinants of Health   Financial Resource Strain:   . Difficulty of Paying Living Expenses:   Food Insecurity:   . Worried About Charity fundraiser in the Last Year:   . Arboriculturist in the Last Year:   Transportation Needs:   . Film/video editor (Medical):   Marland Kitchen Lack of Transportation (Non-Medical):   Physical Activity:   . Days of Exercise per Week:   . Minutes of Exercise per Session:    Stress:   . Feeling of Stress :   Social Connections:   . Frequency of Communication with Friends and Family:   . Frequency of Social Gatherings with Friends and Family:   . Attends Religious Services:   . Active Member of Clubs or Organizations:   . Attends Archivist Meetings:   Marland Kitchen Marital Status:      Review of Systems: A 12 point ROS discussed and pertinent positives are indicated in the HPI above.  All other systems are negative.  Review of Systems  Vital Signs: There were no vitals taken for this visit.  Physical Exam Constitutional:      General: She is not in acute distress.    Appearance: She is obese. She is not ill-appearing.  HENT:     Mouth/Throat:     Mouth: Mucous membranes are moist.     Pharynx: Oropharynx is clear.  Eyes:     Extraocular Movements: Extraocular movements intact.     Pupils: Pupils are equal, round, and reactive to light.  Cardiovascular:     Rate and Rhythm: Normal rate and regular rhythm.     Heart sounds: Normal heart sounds.  Pulmonary:     Effort: Pulmonary effort is normal. No respiratory distress.     Breath sounds: Normal breath sounds.  Skin:    General: Skin is warm and dry.  Neurological:     General: No focal deficit present.     Mental Status: She is alert and oriented to person, place, and time.     Cranial Nerves: No cranial nerve deficit.     Sensory: No sensory deficit.  Psychiatric:        Mood and Affect: Mood normal.        Thought Content: Thought content normal.        Judgment: Judgment normal.      Imaging: CT Angio Head W or Wo Contrast  Result Date: 01/12/2020 CLINICAL DATA:  64 year old female with sudden onset headache, nausea, vomiting. EXAM: CT ANGIOGRAPHY HEAD TECHNIQUE: Multidetector CT imaging of the head was performed using the standard protocol during bolus administration of intravenous contrast. Multiplanar CT image reconstructions and MIPs were obtained to evaluate the vascular  anatomy. CONTRAST:  145mL OMNIPAQUE IOHEXOL 350 MG/ML SOLN COMPARISON:  Brain MRI 09/11/2012. Head CT 07/04/2005. FINDINGS: CT HEAD Brain: Small chronic left caudate lacunar infarct is stable. Cerebral volume is within normal limits for age. Minimal to mild for age scattered white matter hypodensity. No midline shift, ventriculomegaly, mass effect, evidence of mass lesion, intracranial hemorrhage or evidence of cortically based acute  infarction. Calvarium and skull base: Stable, negative. Paranasal sinuses: Visualized paranasal sinuses and mastoids are clear. Orbits: Visualized orbits and scalp soft tissues are within normal limits. CTA HEAD Posterior circulation: Patent distal vertebral arteries with no plaque or stenosis. The left V4 segment is dominant. Patent right PICA and dominant appearing left AICA origins. Patent vertebrobasilar junction. Patent basilar artery without stenosis. Patent SCA and PCA origins. Tortuous left P1. Left posterior communicating artery is present, the right is diminutive. Left PCA branches are within normal limits. There is a moderate stenosis of the right PCA distal P1 segment (series 15, image 11 and series 17, image 22). Right PCA branches are within normal limits. Anterior circulation: Negative distal cervical ICAs. Both ICA siphons are patent. No plaque or stenosis on the left. Normal left ophthalmic and posterior communicating artery origins. No right ICA siphon plaque or stenosis. Normal right ophthalmic and posterior communicating artery origins. Patent carotid termini. Normal MCA and ACA origins. The proximal A2 segments simulate azygos type ACA anatomy. Bilateral ACA branches are within normal limits. Left MCA M1 segment and trifurcation are patent without stenosis. Right MCA M1 segment and bifurcation are patent without stenosis. Bilateral MCA branches are within normal limits. Venous sinuses: Early contrast timing. The superior sagittal sinus is patent along with the  dominant right transverse and sigmoid sinuses. Anatomic variants: Dominant left vertebral artery V4 segment. Fetal type left PCA origin. A2 segments simulating azygos type ACA anatomy. Review of the MIP images confirms the above findings IMPRESSION: 1. 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 2. No acute intracranial abnormality. Chronic left caudate lacunar infarct. Electronically Signed   By: Genevie Ann M.D.   On: 01/12/2020 17:23   CT ABDOMEN PELVIS W CONTRAST  Result Date: 01/12/2020 CLINICAL DATA:  Right lower quadrant pain. Appendicitis suspected. Fever. Nausea. EXAM: CT ABDOMEN AND PELVIS WITH CONTRAST TECHNIQUE: Multidetector CT imaging of the abdomen and pelvis was performed using the standard protocol following bolus administration of intravenous contrast. CONTRAST:  141mL OMNIPAQUE IOHEXOL 350 MG/ML SOLN COMPARISON:  Noncontrast abdominal CT 06/27/2014 FINDINGS: Lower chest: No focal airspace disease or pleural fluid. There is wall thickening of the distal esophagus. Hepatobiliary: Tiny subcentimeter hypodensity in the central liver is incompletely characterized given size. Post cholecystectomy. There is intra and mild extrahepatic biliary ductal dilatation. Common bile duct measures 10 mm distally. Pancreas: Parenchymal atrophy. No ductal dilatation or inflammation. Spleen: Stable peripheral subcapsular calcification about the lateral spleen. No new abnormality. No splenomegaly. Adrenals/Urinary Tract: Normal adrenal glands. No hydronephrosis or perinephric edema. Small low-density lesion in the posterior mid right kidney is too small to characterize but likely small cyst. Homogeneous renal enhancement with symmetric excretion on delayed phase imaging. Urinary bladder is physiologically distended without wall thickening. Stomach/Bowel: Normal appendix, series 20, image 37. No evidence of appendicitis. Mild wall thickening of the distal esophagus. Stomach is  decompressed and not well assessed. No small bowel obstruction or inflammation. No small bowel wall thickening. Small volume of stool throughout the colon. No colonic inflammation. Vascular/Lymphatic: Abdominal aorta is normal in caliber. Retroaortic left renal vein. Portal vein is patent. No suspicious abdominopelvic adenopathy. Reproductive: Status post hysterectomy. No adnexal masses. Other: No ascites or free air. Small umbilical hernia contains fat. Midline lower abdominal wall ventral hernia contains fat. No inflammatory change. Musculoskeletal: Multilevel degenerative change in the spine with scoliosis and vacuum phenomenon. There are no acute or suspicious osseous abnormalities. IMPRESSION: 1. Normal appendix. No explanation for  right lower quadrant pain. 2. Postcholecystectomy with intra and mild extrahepatic biliary ductal dilatation. This may be normal for this patient, and appears similar to 2015 exam allowing for differences in technique. No further evaluation is recommended given normal LFTs. 3. Mild wall thickening of the distal esophagus, can be seen with reflux or esophagitis. 4. Fat containing umbilical and lower ventral abdominal wall hernias. Electronically Signed   By: Keith Rake M.D.   On: 01/12/2020 17:23   DG Chest Portable 1 View  Result Date: 01/12/2020 CLINICAL DATA:  Shortness of breath and fever. EXAM: PORTABLE CHEST 1 VIEW COMPARISON:  November 22, 2009 FINDINGS: The heart size and mediastinal contours are within normal limits. Both lungs are clear. The visualized skeletal structures are unremarkable. IMPRESSION: No active disease. Electronically Signed   By: Abelardo Diesel M.D.   On: 01/12/2020 13:26    Labs:  CBC: Recent Labs    01/12/20 1343  WBC 7.3  HGB 13.7  HCT 39.9  PLT 266    COAGS: No results for input(s): INR, APTT in the last 8760 hours.  BMP: Recent Labs    01/12/20 1343  NA 135  K 3.8  CL 98  CO2 25  GLUCOSE 107*  BUN 12  CALCIUM 9.6   CREATININE 0.77  GFRNONAA >60  GFRAA >60    LIVER FUNCTION TESTS: Recent Labs    01/12/20 1343  BILITOT 1.2  AST 27  ALT 18  ALKPHOS 86  PROT 7.5  ALBUMIN 4.2    TUMOR MARKERS: No results for input(s): AFPTM, CEA, CA199, CHROMGRNA in the last 8760 hours.  Assessment and Plan: (R)PCA P1 segment stenosis by CTA Likely incidental finding as not likely the source of her sxs. Discussed options with the pt. Plan for formal diagnostic cerebral arteriogram to further evaluate. Can then decide if treatment or observation is necessary. Pt agreeable. Cont ASA 81mg  in the meantime. Risks and benefits of cerebral arteriogram were discussed with the patient including, but not limited to bleeding, infection, vascular injury or contrast induced renal failure.  This interventional procedure involves the use of X-rays and because of the nature of the planned procedure, it is possible that we will have prolonged use of X-ray fluoroscopy.  Potential radiation risks to you include (but are not limited to) the following: - A slightly elevated risk for cancer  several years later in life. This risk is typically less than 0.5% percent. This risk is low in comparison to the normal incidence of human cancer, which is 33% for women and 50% for men according to the [employer]. - Radiation induced injury can include skin redness, resembling a rash, tissue breakdown / ulcers and hair loss (which can be temporary or permanent).   The likelihood of either of these occurring depends on the difficulty of the procedure and whether you are sensitive to radiation due to previous procedures, disease, or genetic conditions.   IF your procedure requires a prolonged use of radiation, you will be notified and given written instructions for further action.  It is your responsibility to monitor the irradiated area for the 2 weeks following the procedure and to notify your physician if you are concerned  that you have suffered a radiation induced injury.    All of the patient's questions were answered, patient is agreeable to proceed.   Thank you for this interesting consult.  I greatly enjoyed meeting SHARDAYE MINCE and look forward to participating in their care.  A copy  of this report was sent to the requesting provider on this date.  Electronically Signed: Ascencion Dike, PA-C 02/06/2020, 2:36 PM   I spent a total of 20 minutes in face to face in clinical consultation, greater than 50% of which was counseling/coordinating care for intracranial stenosis.

## 2020-02-07 ENCOUNTER — Other Ambulatory Visit (HOSPITAL_COMMUNITY): Payer: Self-pay | Admitting: Interventional Radiology

## 2020-02-07 DIAGNOSIS — I771 Stricture of artery: Secondary | ICD-10-CM

## 2020-02-08 ENCOUNTER — Telehealth (HOSPITAL_COMMUNITY): Payer: Self-pay

## 2020-02-08 NOTE — Telephone Encounter (Signed)
Returned pt's call, no answer, no vm. AW 

## 2020-02-15 ENCOUNTER — Other Ambulatory Visit: Payer: Self-pay | Admitting: Radiology

## 2020-02-16 ENCOUNTER — Ambulatory Visit (HOSPITAL_COMMUNITY)
Admission: RE | Admit: 2020-02-16 | Discharge: 2020-02-16 | Disposition: A | Discharge status: Home / Self Care | Payer: Medicare Other | Source: Ambulatory Visit | Attending: Interventional Radiology | Admitting: Interventional Radiology

## 2020-02-16 ENCOUNTER — Other Ambulatory Visit (HOSPITAL_COMMUNITY): Payer: Self-pay | Admitting: Interventional Radiology

## 2020-02-16 ENCOUNTER — Encounter (HOSPITAL_COMMUNITY): Payer: Self-pay

## 2020-02-16 ENCOUNTER — Other Ambulatory Visit: Payer: Self-pay

## 2020-02-16 DIAGNOSIS — I6621 Occlusion and stenosis of right posterior cerebral artery: Secondary | ICD-10-CM | POA: Insufficient documentation

## 2020-02-16 DIAGNOSIS — E7800 Pure hypercholesterolemia, unspecified: Secondary | ICD-10-CM | POA: Insufficient documentation

## 2020-02-16 DIAGNOSIS — I10 Essential (primary) hypertension: Secondary | ICD-10-CM | POA: Diagnosis not present

## 2020-02-16 DIAGNOSIS — I771 Stricture of artery: Secondary | ICD-10-CM

## 2020-02-16 DIAGNOSIS — G4733 Obstructive sleep apnea (adult) (pediatric): Secondary | ICD-10-CM | POA: Diagnosis not present

## 2020-02-16 DIAGNOSIS — Z7982 Long term (current) use of aspirin: Secondary | ICD-10-CM | POA: Insufficient documentation

## 2020-02-16 DIAGNOSIS — J45909 Unspecified asthma, uncomplicated: Secondary | ICD-10-CM | POA: Insufficient documentation

## 2020-02-16 DIAGNOSIS — Z79899 Other long term (current) drug therapy: Secondary | ICD-10-CM | POA: Insufficient documentation

## 2020-02-16 HISTORY — PX: IR ANGIO INTRA EXTRACRAN SEL COM CAROTID INNOMINATE BILAT MOD SED: IMG5360

## 2020-02-16 HISTORY — PX: IR US GUIDE VASC ACCESS RIGHT: IMG2390

## 2020-02-16 HISTORY — PX: IR ANGIO VERTEBRAL SEL VERTEBRAL BILAT MOD SED: IMG5369

## 2020-02-16 LAB — CBC WITH DIFFERENTIAL/PLATELET
Abs Immature Granulocytes: 0.01 10*3/uL (ref 0.00–0.07)
Basophils Absolute: 0.1 10*3/uL (ref 0.0–0.1)
Basophils Relative: 1 %
Eosinophils Absolute: 0.2 10*3/uL (ref 0.0–0.5)
Eosinophils Relative: 3 %
HCT: 38.6 % (ref 36.0–46.0)
Hemoglobin: 13 g/dL (ref 12.0–15.0)
Immature Granulocytes: 0 %
Lymphocytes Relative: 31 %
Lymphs Abs: 1.9 10*3/uL (ref 0.7–4.0)
MCH: 31 pg (ref 26.0–34.0)
MCHC: 33.7 g/dL (ref 30.0–36.0)
MCV: 92.1 fL (ref 80.0–100.0)
Monocytes Absolute: 0.6 10*3/uL (ref 0.1–1.0)
Monocytes Relative: 9 %
Neutro Abs: 3.6 10*3/uL (ref 1.7–7.7)
Neutrophils Relative %: 56 %
Platelets: 240 10*3/uL (ref 150–400)
RBC: 4.19 MIL/uL (ref 3.87–5.11)
RDW: 12.5 % (ref 11.5–15.5)
WBC: 6.3 10*3/uL (ref 4.0–10.5)
nRBC: 0 % (ref 0.0–0.2)

## 2020-02-16 LAB — BASIC METABOLIC PANEL
Anion gap: 9 (ref 5–15)
BUN: 14 mg/dL (ref 8–23)
CO2: 27 mmol/L (ref 22–32)
Calcium: 9.5 mg/dL (ref 8.9–10.3)
Chloride: 99 mmol/L (ref 98–111)
Creatinine, Ser: 0.91 mg/dL (ref 0.44–1.00)
GFR calc Af Amer: >60 mL/min (ref >60)
GFR calc non Af Amer: >60 mL/min (ref >60)
Glucose, Bld: 99 mg/dL (ref 70–99)
Potassium: 3.6 mmol/L (ref 3.5–5.1)
Sodium: 135 mmol/L (ref 135–145)

## 2020-02-16 LAB — PROTIME-INR
INR: 1 (ref 0.8–1.2)
Prothrombin Time: 12.6 seconds (ref 11.4–15.2)

## 2020-02-16 MED ORDER — LIDOCAINE HCL (PF) 1 % IJ SOLN
INTRAMUSCULAR | Status: AC | PRN
  Administered 2020-02-16: 2 mL
  Administered 2020-02-16: 10 mL

## 2020-02-16 MED ORDER — MIDAZOLAM HCL 2 MG/2ML IJ SOLN
INTRAMUSCULAR | Status: AC | PRN
  Administered 2020-02-16: 1 mg via INTRAVENOUS

## 2020-02-16 MED ORDER — OXYCODONE HCL 5 MG PO TABS
15.0000 mg | ORAL_TABLET | Freq: Once | ORAL | Status: AC
  Administered 2020-02-16: 12:00:00 15 mg via ORAL
  Filled 2020-02-16 (×2): qty 3

## 2020-02-16 MED ORDER — FENTANYL CITRATE (PF) 100 MCG/2ML IJ SOLN
INTRAMUSCULAR | Status: AC | PRN
  Administered 2020-02-16: 25 ug via INTRAVENOUS

## 2020-02-16 MED ORDER — MIDAZOLAM HCL 2 MG/2ML IJ SOLN
INTRAMUSCULAR | Status: AC
  Filled 2020-02-16: qty 2

## 2020-02-16 MED ORDER — FENTANYL CITRATE (PF) 100 MCG/2ML IJ SOLN
INTRAMUSCULAR | Status: AC
  Filled 2020-02-16: qty 2

## 2020-02-16 MED ORDER — VERAPAMIL HCL 2.5 MG/ML IV SOLN
INTRAVENOUS | Status: AC
  Filled 2020-02-16: qty 2

## 2020-02-16 MED ORDER — IOHEXOL 300 MG/ML  SOLN
150.0000 mL | Freq: Once | INTRAMUSCULAR | Status: AC | PRN
  Administered 2020-02-16: 10:00:00 75 mL via INTRA_ARTERIAL

## 2020-02-16 MED ORDER — SODIUM CHLORIDE 0.9 % IV SOLN: INTRAVENOUS | Status: AC

## 2020-02-16 MED ORDER — NITROGLYCERIN 1 MG/10 ML FOR IR/CATH LAB
INTRA_ARTERIAL | Status: AC
  Filled 2020-02-16: qty 10

## 2020-02-16 MED ORDER — HEPARIN SODIUM (PORCINE) 1000 UNIT/ML IJ SOLN
INTRAMUSCULAR | Status: AC
  Filled 2020-02-16: qty 1

## 2020-02-16 MED ORDER — SODIUM CHLORIDE 0.9 % IV SOLN: INTRAVENOUS | Status: DC

## 2020-02-16 MED ORDER — HEPARIN SODIUM (PORCINE) 1000 UNIT/ML IJ SOLN
INTRAMUSCULAR | Status: AC | PRN
  Administered 2020-02-16: 1000 [IU] via INTRA_ARTERIAL

## 2020-02-16 MED ORDER — LIDOCAINE HCL 1 % IJ SOLN
INTRAMUSCULAR | Status: AC
  Filled 2020-02-16: qty 20

## 2020-02-16 NOTE — H&P (Signed)
Chief Complaint: Patient was seen in consultation today for cerebral arteriogram at the request of Dr Star Age   Supervising Physician: Luanne Bras  Patient Status: [facility] - Out-pt  History of Present Illness: Cassandra Kelley is a 64 y.o. female   Pt suffering continued headaches for 2 - 3 weeks Nausea and occasional vomiting; abd pain To ED 3/25 Imaging IMPRESSION: 1. 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 2. No acute intracranial abnormality. Chronic left caudate lacunar infarct.  IMPRESSION: 1. Normal appendix. No explanation for right lower quadrant pain. 2. Postcholecystectomy with intra and mild extrahepatic biliary ductal dilatation. This may be normal for this patient, and appears similar to 2015 exam allowing for differences in technique. No further evaluation is recommended given normal LFTs. 3. Mild wall thickening of the distal esophagus, can be seen with reflux or esophagitis. 4. Fat containing umbilical and lower ventral abdominal wall hernias.  Was seen in consultation with Dr Estanislado Pandy 02/06/20  Assessment and Plan: (R)PCA P1 segment stenosis by CTA Likely incidental finding as not likely the source of her sxs. Discussed options with the pt. Plan for formal diagnostic cerebral arteriogram to further evaluate. Can then decide if treatment or observation is necessary. Pt agreeable. Cont ASA 81mg  in the meantime. Risks and benefits of cerebral arteriogram were discussed with the patient including, but not limited to bleeding, infection, vascular injury or contrast induced renal failure.  Past Medical History:  Diagnosis Date  . Asthma   . Cervical cancer ([place]) dx'd ~ 2005  . Chronic headaches   . Constipation   . High cholesterol   . Hypertension   . OSA (obstructive sleep apnea)     Past Surgical History:  Procedure Laterality Date  . CHOLECYSTECTOMY    . HEMORRHOID SURGERY    .  hemrr    . TUBAL LIGATION    . VAGINAL HYSTERECTOMY      Allergies: Patient has no known allergies.  Medications: Prior to Admission medications   Medication Sig Start Date End Date Taking? Authorizing Provider  aspirin 81 MG tablet Take 81 mg by mouth daily.   Yes [provider]  Calcium Carb-Cholecalciferol (CALCIUM PLUS VITAMIN D3 PO) Take 1 tablet by mouth 2 (two) times daily.    Yes [provider]  cetirizine (ZYRTEC) 10 MG tablet Take 10 mg by mouth daily.   Yes [provider]  gabapentin (NEURONTIN) 300 MG capsule Take 300 mg by mouth at bedtime.    Yes [provider]  Multiple Vitamins-Minerals (MULTIVITAL PO) Take 1 tablet by mouth daily. Once a day    Yes [provider]  omeprazole (PRILOSEC) 20 MG capsule Take 20 mg by mouth daily.   Yes [provider]  oxyCODONE (ROXICODONE) 15 MG immediate release tablet Take 15 mg by mouth 4 (four) times daily as needed. 02/03/20  Yes [provider]  polyethylene glycol (MIRALAX / GLYCOLAX) 17 g packet Take 17 g by mouth daily as needed for moderate constipation.    Yes [provider]  pravastatin (PRAVACHOL) 40 MG tablet Take 40 mg by mouth daily.   Yes [provider]  telmisartan-hydrochlorothiazide (MICARDIS HCT) 80-12.5 MG tablet Take 1 tablet by mouth daily.   Yes [provider]     Family History  Problem Relation Age of Onset  . Lung cancer Mother   . Cancer Maternal Aunt   . Heart disease Maternal Grandfather   . Heart disease  Maternal Grandmother   . Heart disease Paternal Grandfather   . Heart disease Paternal Grandmother   . Asthma Son   . Asthma Father   . Asthma Maternal Uncle   . Allergies Other        everyone    Social History   Socioeconomic History  . Marital status: Married    Spouse name: Not on file  . Number of children: 2  . Years of education: `  . Highest education level: Not on file  Occupational  History  . Occupation: retired/disabled  Tobacco Use  . Smoking status: Never Smoker  . Smokeless tobacco: Never Used  Substance and Sexual Activity  . Alcohol use: No  . Drug use: No  . Sexual activity: Not on file  Other Topics Concern  . Not on file  Social History Narrative  . Not on file   Social Determinants of Health   Financial Resource Strain:   . Difficulty of Paying Living Expenses:   Food Insecurity:   . Worried About Charity fundraiser in the Last Year:   . Arboriculturist in the Last Year:   Transportation Needs:   . Film/video editor (Medical):   Marland Kitchen Lack of Transportation (Non-Medical):   Physical Activity:   . Days of Exercise per Week:   . Minutes of Exercise per Session:   Stress:   . Feeling of Stress :   Social Connections:   . Frequency of Communication with Friends and Family:   . Frequency of Social Gatherings with Friends and Family:   . Attends Religious Services:   . Active Member of Clubs or Organizations:   . Attends Archivist Meetings:   Marland Kitchen Marital Status:      Review of Systems: A 12 point ROS discussed and pertinent positives are indicated in the HPI above.  All other systems are negative.  Review of Systems  Constitutional: Negative for activity change, appetite change, fatigue and fever.  HENT: Negative for tinnitus and voice change.   Eyes: Negative for visual disturbance.  Respiratory: Negative for cough and shortness of breath.   Cardiovascular: Negative for chest pain.  Gastrointestinal: Positive for abdominal pain and nausea.  Neurological: Positive for headaches. Negative for dizziness, tremors, seizures, syncope, facial asymmetry, speech difficulty, weakness, light-headedness and numbness.  Psychiatric/Behavioral: Negative for behavioral problems and confusion.    Vital Signs: BP 112/66   Pulse (!) 56   Temp 98.1 F (36.7 C) (Oral)   Resp 16   Ht 4\' 11"  (1.499 m)   Wt 230 lb (104.3 kg)   SpO2 97%   BMI  46.45 kg/m   Physical Exam Vitals reviewed.  HENT:     Mouth/Throat:     Mouth: Mucous membranes are moist.  Eyes:     Extraocular Movements: Extraocular movements intact.  Cardiovascular:     Rate and Rhythm: Normal rate and regular rhythm.     Heart sounds: Normal heart sounds.  Pulmonary:     Effort: Pulmonary effort is normal.     Breath sounds: Normal breath sounds.  Musculoskeletal:        General: Normal range of motion.     Right lower leg: No edema.     Left lower leg: No edema.  Skin:    General: Skin is warm.  Neurological:     Mental Status: She is alert and oriented to person, place, and time.  Psychiatric:        Mood  and Affect: Mood normal.        Behavior: Behavior normal.        Thought Content: Thought content normal.        Judgment: Judgment normal.     Imaging: No results found.  Labs:  CBC: Recent Labs    01/12/20 1343 02/16/20 0700  WBC 7.3 6.3  HGB 13.7 13.0  HCT 39.9 38.6  PLT 266 240    COAGS: Recent Labs    02/16/20 0700  INR 1.0    BMP: Recent Labs    01/12/20 1343 02/16/20 0700  NA 135 135  K 3.8 3.6  CL 98 99  CO2 25 27  GLUCOSE 107* 99  BUN 12 14  CALCIUM 9.6 9.5  CREATININE 0.77 0.91  GFRNONAA >60 >60  GFRAA >60 >60    LIVER FUNCTION TESTS: Recent Labs    01/12/20 1343  BILITOT 1.2  AST 27  ALT 18  ALKPHOS 86  PROT 7.5  ALBUMIN 4.2    TUMOR MARKERS: No results for input(s): AFPTM, CEA, CA199, CHROMGRNA in the last 8760 hours.  Assessment and Plan:  Headaches and Nausea CTA revealing Moderate stenosis of the Right PCA P1 segment Scheduled now for cerebral arteriogram   Risks and benefits of cerebral angiogram with intervention were discussed with the patient including, but not limited to bleeding, infection, vascular injury, contrast induced renal failure, stroke or even death.  This interventional procedure involves the use of X-rays and because of the nature of the planned procedure, it is  possible that we will have prolonged use of X-ray fluoroscopy.  Potential radiation risks to you include (but are not limited to) the following: - A slightly elevated risk for cancer  several years later in life. This risk is typically less than 0.5% percent. This risk is low in comparison to the normal incidence of human cancer, which is 33% for women and 50% for men according to the [place]. - Radiation induced injury can include skin redness, resembling a rash, tissue breakdown / ulcers and hair loss (which can be temporary or permanent).   The likelihood of either of these occurring depends on the difficulty of the procedure and whether you are sensitive to radiation due to previous procedures, disease, or genetic conditions.   IF your procedure requires a prolonged use of radiation, you will be notified and given written instructions for further action.  It is your responsibility to monitor the irradiated area for the 2 weeks following the procedure and to notify your physician if you are concerned that you have suffered a radiation induced injury.    All of the patient's questions were answered, patient is agreeable to proceed.  Consent signed and in chart.  Thank you for this interesting consult.  I greatly enjoyed meeting Cassandra Kelley and look forward to participating in their care.  A copy of this report was sent to the requesting provider on this date.  Electronically Signed: Lavonia Drafts, PA-C 02/16/2020, 8:22 AM   I spent a total of    25 Minutes in face to face in clinical consultation, greater than 50% of which was counseling/coordinating care for cerebral arteriogram

## 2020-02-16 NOTE — Procedures (Signed)
S/P 4 vessel cerebral arteriogram RT CFA approach. Findings. 1.No significant intracranial stenosis  Or occlusions seen. 2.Venous outflow WNLs. S.Aylyn Wenzler MD

## 2020-02-16 NOTE — Discharge Instructions (Addendum)
Radial Site Care  This sheet gives you information about how to care for yourself after your procedure. Your health care provider may also give you more specific instructions. If you have problems or questions, contact your health care provider. What can I expect after the procedure? After the procedure, it is common to have:  Bruising and tenderness at the catheter insertion area. Follow these instructions at home: Medicines  Take over-the-counter and prescription medicines only as told by your health care provider. Insertion site care  Follow instructions from your health care provider about how to take care of your insertion site. Make sure you: ? Wash your hands with soap and water before you change your bandage (dressing). If soap and water are not available, use hand sanitizer. ? Change your dressing as told by your health care provider. ? Leave stitches (sutures), skin glue, or adhesive strips in place. These skin closures may need to stay in place for 2 weeks or longer. If adhesive strip edges start to loosen and curl up, you may trim the loose edges. Do not remove adhesive strips completely unless your health care provider tells you to do that.  Check your insertion site every day for signs of infection. Check for: ? Redness, swelling, or pain. ? Fluid or blood. ? Pus or a bad smell. ? Warmth.  Do not take baths, swim, or use a hot tub until your health care provider approves.  You may shower 24-48 hours after the procedure, or as directed by your health care provider. ? Remove the dressing and gently wash the site with plain soap and water. ? Pat the area dry with a clean towel. ? Do not rub the site. That could cause bleeding.  Do not apply powder or lotion to the site. Activity   For 24 hours after the procedure, or as directed by your health care provider: ? Do not flex or bend the affected arm. ? Do not push or pull heavy objects with the affected arm. ? Do not  drive yourself home from the hospital or clinic. You may drive 24 hours after the procedure unless your health care provider tells you not to. ? Do not operate machinery or power tools.  Do not lift anything that is heavier than 10 lb (4.5 kg), or the limit that you are told, until your health care provider says that it is safe.  Ask your health care provider when it is okay to: ? Return to work or school. ? Resume usual physical activities or sports. ? Resume sexual activity. General instructions  If the catheter site starts to bleed, raise your arm and put firm pressure on the site. If the bleeding does not stop, get help right away. This is a medical emergency.  If you went home on the same day as your procedure, a responsible adult should be with you for the first 24 hours after you arrive home.  Keep all follow-up visits as told by your health care provider. This is important. Contact a health care provider if:  You have a fever.  You have redness, swelling, or yellow drainage around your insertion site. Get help right away if:  You have unusual pain at the radial site.  The catheter insertion area swells very fast.  The insertion area is bleeding, and the bleeding does not stop when you hold steady pressure on the area.  Your arm or hand becomes pale, cool, tingly, or numb. These symptoms may represent a serious problem   that is an emergency. Do not wait to see if the symptoms will go away. Get medical help right away. Call your local emergency services (911 in the [place] Do not drive yourself to the hospital. Summary  After the procedure, it is common to have bruising and tenderness at the site.  Follow instructions from your health care provider about how to take care of your radial site wound. Check the wound every day for signs of infection.  Do not lift anything that is heavier than 10 lb (4.5 kg), or the limit that you are told, until your health care provider says  that it is safe. This information is not intended to replace advice given to you by your health care provider. Make sure you discuss any questions you have with your health care provider. Document Revised: 11/11/2017 Document Reviewed: 11/11/2017 Elsevier Patient Education  2020 [employer] Cerebral Angiogram, Care After This sheet gives you information about how to care for yourself after your procedure. Your health care provider may also give you more specific instructions. If you have problems or questions, contact your health care provider. What can I expect after the procedure? After the procedure, it is common to have:  Bruising and tenderness at the catheter insertion site.  A mild headache. Follow these instructions at home: Insertion site care  Follow instructions from your health care provider about how to take care of the insertion site. Make sure you: ? Wash your hands with soap and water before and after you change your bandage (dressing). If soap and water are not available, use hand sanitizer. ? Change your dressing as told by your health care provider.  Do not take baths, swim, or use a hot tub until your health care provider approves. You may shower 24-48 hours after the procedure, or as told by your health care provider.  To clean your insertion site: ? Gently wash the site with plain soap and water. ? Pat the area dry with a clean towel. ? Do not rub the site. This may cause bleeding.  Do not apply powder or lotion to the site. Keep the site clean and dry. Infection signs Check your incision area every day for signs of infection. Check for:  Redness, swelling, or pain.  Fluid or blood.  Warmth.  Pus or a bad smell.  Activity  Do not drive for 24 hours if you were given a sedative during your procedure.  Rest as told by your health care provider.  Do not lift anything that is heavier than 10 lb (4.5 kg), or the limit that you are told, until your  health care provider says that it is safe.  Return to your normal activities as told by your health care provider, usually in about a week. Ask your health care provider what activities are safe for you. General instructions   If your insertion site starts to bleed, lie flat and put pressure on the site. If the bleeding does not stop, get help right away. This is a medical emergency.  Do not use any products that contain nicotine or tobacco, such as cigarettes, e-cigarettes, and chewing tobacco. If you need help quitting, ask your health care provider.  Take over-the-counter and prescription medicines only as told by your health care provider.  Drink enough fluid to keep your urine pale yellow. This helps flush the contrast dye from your body.  Keep all follow-up visits as directed by your health care provider. This is important. Contact a health   care provider if:  You have a fever or chills.  You have redness, swelling, or pain around your insertion site.  You have fluid or blood coming from your insertion site.  The insertion site feels warm to the touch.  You have pus or a bad smell coming from your insertion site.  You notice blood collecting in the tissue around the insertion site (hematoma). The hematoma may be painful to the touch. Get help right away if:  You have chest pain or trouble breathing.  You have severe pain or swelling at the insertion site.  The insertion area bleeds, and bleeding continues after 30 minutes of holding steady pressure on the site.  The arm or leg where the catheter was inserted is pale, cold, numb, tingling, or weak.  You have a rash.  You have any symptoms of a stroke. "BE FAST" is an easy way to remember the main warning signs of a stroke: ? B - Balance. Signs are dizziness, sudden trouble walking, or loss of balance. ? E - Eyes. Signs are trouble seeing or a sudden change in vision. ? F - Face. Signs are sudden weakness or numbness of  the face, or the face or eyelid drooping on one side. ? A - Arms. Signs are weakness or numbness in an arm. This happens suddenly and usually on one side of the body. ? S - Speech. Signs are sudden trouble speaking, slurred speech, or trouble understanding what people say. ? T - Time. Time to call emergency services. Write down what time symptoms started.  You have other signs of a stroke, such as: ? A sudden, severe headache with no known cause. ? Nausea or vomiting. ? Seizure. These symptoms may represent a serious problem that is an emergency. Do not wait to see if the symptoms will go away. Get medical help right away. Call your local emergency services (911 in the [place] Do not drive yourself to the hospital. Summary  Bruising and tenderness at the insertion site are common.  Follow your health care provider's instructions about caring for your insertion site. Change dressing and clean the area as instructed.  If your insertion site bleeds, apply direct pressure until bleeding stops.  Return to your normal activities as told by your health care provider. Ask what activities are safe.  Rest and drink plenty of fluids. This information is not intended to replace advice given to you by your health care provider. Make sure you discuss any questions you have with your health care provider. Document Revised: 04/26/2019 Document Reviewed: 04/26/2019 Elsevier Patient Education  2020 [employer] Moderate Conscious Sedation, Adult Sedation is the use of medicines to promote relaxation and relieve discomfort and anxiety. Moderate conscious sedation is a type of sedation. Under moderate conscious sedation, you are less alert than normal, but you are still able to respond to instructions, touch, or both. Moderate conscious sedation is used during short medical and dental procedures. It is milder than deep sedation, which is a type of sedation under which you cannot be easily woken up. It is also  milder than general anesthesia, which is the use of medicines to make you unconscious. Moderate conscious sedation allows you to return to your regular activities sooner. Tell a health care provider about:  Any allergies you have.  All medicines you are taking, including vitamins, herbs, eye drops, creams, and over-the-counter medicines.  Use of steroids (by mouth or creams).  Any problems you or family members have had with   sedatives and anesthetic medicines.  Any blood disorders you have.  Any surgeries you have had.  Any medical conditions you have, such as sleep apnea.  Whether you are pregnant or may be pregnant.  Any use of cigarettes, alcohol, marijuana, or street drugs. What are the risks? Generally, this is a safe procedure. However, problems may occur, including:  Getting too much medicine (oversedation).  Nausea.  Allergic reaction to medicines.  Trouble breathing. If this happens, a breathing tube may be used to help with breathing. It will be removed when you are awake and breathing on your own.  Heart trouble.  Lung trouble. What happens before the procedure? Staying hydrated Follow instructions from your health care provider about hydration, which may include:  Up to 2 hours before the procedure - you may continue to drink clear liquids, such as water, clear fruit juice, black coffee, and plain tea. Eating and drinking restrictions Follow instructions from your health care provider about eating and drinking, which may include:  8 hours before the procedure - stop eating heavy meals or foods such as meat, fried foods, or fatty foods.  6 hours before the procedure - stop eating light meals or foods, such as toast or cereal.  6 hours before the procedure - stop drinking milk or drinks that contain milk.  2 hours before the procedure - stop drinking clear liquids. Medicine Ask your health care provider about:  Changing or stopping your regular medicines.  This is especially important if you are taking diabetes medicines or blood thinners.  Taking medicines such as aspirin and ibuprofen. These medicines can thin your blood. Do not take these medicines before your procedure if your health care provider instructs you not to.  Tests and exams  You will have a physical exam.  You may have blood tests done to show: ? How well your kidneys and liver are working. ? How well your blood can clot. General instructions  Plan to have someone take you home from the hospital or clinic.  If you will be going home right after the procedure, plan to have someone with you for 24 hours. What happens during the procedure?  An IV tube will be inserted into one of your veins.  Medicine to help you relax (sedative) will be given through the IV tube.  The medical or dental procedure will be performed. What happens after the procedure?  Your blood pressure, heart rate, breathing rate, and blood oxygen level will be monitored often until the medicines you were given have worn off.  Do not drive for 24 hours. This information is not intended to replace advice given to you by your health care provider. Make sure you discuss any questions you have with your health care provider. Document Revised: 09/18/2017 Document Reviewed: 01/26/2016 Elsevier Patient Education  2020 [employer] Inc[employer]  

## 2020-02-22 ENCOUNTER — Telehealth: Payer: Self-pay

## 2020-02-22 NOTE — Progress Notes (Signed)
I am not sure, if patient heard from Dr. 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 We can make a FU appt in clinic in 6 mo and order CT angio at that time. Please advise patient and assist with appt, may see NP too.

## 2020-02-22 NOTE — Telephone Encounter (Addendum)
I reached out to the pt. She would prefer to keep the appt with our office and have our office repeat the ct angiogram in a month. Pt reported after she and I originally spoke she changed her mind and tried to call back but ran into long hold time. Pt will keep Nov appt as scheduled.

## 2020-02-22 NOTE — Telephone Encounter (Signed)
-----   Message from Star Age, MD sent at 02/22/2020  7:24 AM EDT ----- I am not sure, if patient heard from Dr. 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 We can make a FU appt in clinic in 6 mo and order CT angio at that time. Please advise patient and assist with appt, may see NP too.

## 2020-02-22 NOTE — Telephone Encounter (Signed)
The follow-up appointment was in order to schedule repeat CT angiogram.  She does not actually need a follow-up appointment then, please advise patient that we can have her follow-up as needed, if she is agreeable.

## 2020-02-22 NOTE — Telephone Encounter (Signed)
I reached out to the pt to discuss. She reports she has been in touch with Dr. Arlean Hopping office and has been notified of results. She also sts Dr. Konnor Sims will handle the repeat angiogram order.  Pt has been scheduled for her 6 month f/u with our office on 08/25/2020.

## 2020-03-01 ENCOUNTER — Ambulatory Visit (HOSPITAL_COMMUNITY): Payer: Medicare Other

## 2020-05-29 ENCOUNTER — Telehealth: Payer: Self-pay | Admitting: Neurology

## 2020-05-29 ENCOUNTER — Telehealth: Payer: Self-pay | Admitting: Student

## 2020-05-29 NOTE — Telephone Encounter (Signed)
I reached out to the pt. She sts recently she was evaluated by her PCP and was told she had a stroke.   I asked the pt if she knew what scan the PCP was looking at so Dr. Rexene Alberts could review, pt was unsure of what study pcp was looking at.   I re-reviewed the results from 02/22/2020 with the pt-------- Message from Star Age, MD sent at 02/22/2020  7:24 AM EDT ----- I am not sure, if patient heard from Dr. 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 We can make a FU appt in clinic in 6 mo and order CT angio at that time. Please advise patient and assist with appt, may see NP too.  After reviewing the information with the pt she verbalized understanding and will talk with her PCP further about the stroke comment.

## 2020-05-29 NOTE — Telephone Encounter (Signed)
NIR.  Received message from patient requesting call back from PA. Called patient at 1145 to discuss. Patient states that she went to see her PCP yesterday and was told by PCP that she had a CVA. States she is wondering why Dr. Estanislado Pandy did not tell her this. Explained that the most recent (and only) procedure/scan Dr. Estanislado Pandy has done is a diagnostic cerebral arteriogram 02/16/2020- this would not show a CVA. In addition, Dr. Estanislado Pandy is a neurointerventional radiologist- he does not follow CVAs, but rather extracranial/intracranial stenosis. Recommend patient follow-up with her neurologist with these questions. All questions answered and concerns addressed. Patient conveys understanding and agrees with plan.  Please call NIR with questions/concerns.   [name] Avalina Benko, PA-C 05/29/2020, 11:55 AM

## 2020-05-29 NOTE — Telephone Encounter (Signed)
Pt called stating she is wanting to speak to the RN about a poss stroke that another provider found that she may have had. She would like to know why this was not found sooner. Please advise.

## 2020-08-14 ENCOUNTER — Other Ambulatory Visit (HOSPITAL_COMMUNITY): Payer: Self-pay | Admitting: Interventional Radiology

## 2020-08-14 DIAGNOSIS — I771 Stricture of artery: Secondary | ICD-10-CM

## 2020-08-20 ENCOUNTER — Ambulatory Visit (HOSPITAL_COMMUNITY): Payer: Medicare Other

## 2020-08-20 ENCOUNTER — Other Ambulatory Visit: Payer: Self-pay

## 2020-08-20 ENCOUNTER — Ambulatory Visit (HOSPITAL_COMMUNITY)
Admission: RE | Admit: 2020-08-20 | Discharge: 2020-08-20 | Disposition: A | Discharge status: Home / Self Care | Payer: Medicare Other | Source: Ambulatory Visit | Attending: Interventional Radiology | Admitting: Interventional Radiology

## 2020-08-20 DIAGNOSIS — I771 Stricture of artery: Secondary | ICD-10-CM | POA: Insufficient documentation

## 2020-08-20 LAB — POCT I-STAT CREATININE: Creatinine, Ser: 0.9 mg/dL (ref 0.44–1.00)

## 2020-08-20 MED ORDER — IOHEXOL 350 MG/ML SOLN
75.0000 mL | Freq: Once | INTRAVENOUS | Status: AC | PRN
  Administered 2020-08-20: 75 mL via INTRAVENOUS

## 2020-08-21 ENCOUNTER — Telehealth: Payer: Self-pay | Admitting: Student

## 2020-08-21 NOTE — Telephone Encounter (Signed)
NIR.  Dr. Estanislado Pandy called patient [phone]) at [phone] to discuss recent CTA head/neck results (from 08/20/2020).  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 Recommend follow-up with CTA head/neck (with contrast) in 1 year- IR schedulers to call patient to set up this imaging scan. 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 Recommend patient follow-up with PCP for further management (will send message to Dr. Mellody Drown to make aware).  All questions answered and concerns addressed. Please call NIR with questions/concerns.   [name] [name], PA-C 08/21/2020, 10:24 AM

## 2020-08-27 ENCOUNTER — Ambulatory Visit: Payer: Self-pay | Admitting: Neurology

## 2020-09-03 ENCOUNTER — Ambulatory Visit (INDEPENDENT_AMBULATORY_CARE_PROVIDER_SITE_OTHER): Payer: Medicare Other | Admitting: Neurology

## 2020-09-03 ENCOUNTER — Encounter: Payer: Self-pay | Admitting: Neurology

## 2020-09-03 VITALS — BP 121/72 | HR 73 | Ht 59.0 in | Wt 220.0 lb

## 2020-09-03 DIAGNOSIS — I669 Occlusion and stenosis of unspecified cerebral artery: Secondary | ICD-10-CM | POA: Diagnosis not present

## 2020-09-03 NOTE — Progress Notes (Signed)
Subjective:    Patient ID: JORDAN CARAVEO is a 64 y.o. female.  HPI     Interim history:   Ms. Mauck is a 64 year old right-handed woman with an underlying medical history of hypertension, hyperlipidemia, cervical cancer, asthma, obstructive sleep apnea, and morbid obesity with a BMI of over 38, who -year-old on presents for follow-up consultation of her headaches.  The patient is unaccompanied today.  I first met her on 01/24/2020 at the request of the emergency room, she had presented with headaches, abdominal pain and nausea to the emergency room on 01/12/2020.  When I saw her she reported feeling better.  She had undergone a CT angiogram of the head.  I suggested we proceed with a referral to interventional radiology.  I referred her to Dr. Estanislado Pandy.  She had an angiogram of the head and neck arteries on 02/16/2020 which showed: IMPRESSION: Mild stenosis of the right posterior cerebral artery P2 segment.   No areas of intraluminal filling defects, or of stenosis or of occlusions are seen arterially or in the venous phase.   A 6 mon follow-up CT angio head and neck was recommended.  He had a CT angiogram of the head and neck with and without contrast on 08/20/2020 and I reviewed the results: IMPRESSION: 1. Normalized appearance of the right PCA P1/P2 junction compared to the prior CTA. No significant PCA stenosis. And elsewhere minimal plaque with no arterial stenosis in the head or neck.   2. 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 Recommend follow-up with ENT.   3. Stable CT appearance of the brain. 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   She was advised to follow-up with ENT.  She was also notified by phone call from the IR team.  Today, 09/03/2020: She reports that she was told to follow-up with a vascular surgeon. She has not made an appointment yet or has received  a referral from her primary care yet. She is somewhat worried about the paraganglioma. She reports no new symptoms. She reports that yesterday her showerhead fell on the back of her head as she was bent over in the tub. She has not seen her primary care physician yet since she had the CT angiograms. She reports that her pain management specialist has prescribed a walker and a scooter but she has not received them yet.   The patient's allergies, current medications, family history, past medical history, past social history, past surgical history and problem list were reviewed and updated as appropriate.   Previously:   [birth year]: (She) presented to the emergency room on 01/12/2020 with headaches and abdominal pain as well as nausea.  I reviewed the emergency room records. She had a CT angiogram of the head with and without contrast on 01/12/2020 and I reviewed the results: IMPRESSION: 1. 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   2. No acute intracranial abnormality. Chronic left caudate lacunar infarct. She had an abdominal CT with contrast on 01/12/2020 and I reviewed the results: IMPRESSION: 1. Normal appendix. No explanation for right lower quadrant pain. 2. Postcholecystectomy with intra and mild extrahepatic biliary ductal dilatation. This may be normal for this patient, and appears similar to 2015 exam allowing for differences in technique. No further evaluation is recommended given normal LFTs. 3. Mild wall thickening of the distal esophagus, can be seen with reflux or  esophagitis. 4. Fat containing umbilical and lower ventral abdominal wall hernias. Her Covid test was negative.    She reports feeling better.  She started feeling better after about a week with regards to her abdominal pain, nausea and headaches.  She has sleep apnea and has seen her dentist for this, she has an oral appliance but admits that she has not been using it.  She would  be willing to get on track with treatment for sleep apnea again.  She has significant mobility issues, she has significant knee pain, left side worse than right and is followed by Dr. Nelva Bush for this. She has been referred to home health physical therapy.  She walks with a cane.  She tries to hydrate well but admits that she could do better.  She denies any significant recent residual headaches.  She denies any sudden onset of one-sided weakness or numbness or tingling or droopy face or slurring of speech.  Her Past Medical History Is Significant For: Past Medical History:  Diagnosis Date  . Asthma   . Cervical cancer ([place]) dx'd ~ 2005  . Chronic headaches   . Constipation   . High cholesterol   . Hypertension   . OSA (obstructive sleep apnea)     Her Past Surgical History Is Significant For: Past Surgical History:  Procedure Laterality Date  . CHOLECYSTECTOMY    . HEMORRHOID SURGERY    . hemrr    . IR ANGIO INTRA EXTRACRAN SEL COM CAROTID INNOMINATE BILAT MOD SED  02/16/2020  . IR ANGIO VERTEBRAL SEL VERTEBRAL BILAT MOD SED  02/16/2020  . IR US GUIDE VASC ACCESS RIGHT  02/16/2020  . TUBAL LIGATION    . VAGINAL HYSTERECTOMY      Her Family History Is Significant For: Family History  Problem Relation Age of Onset  . Lung cancer Mother   . Cancer Maternal Aunt   . Heart disease Maternal Grandfather   . Heart disease Maternal Grandmother   . Heart disease Paternal Grandfather   . Heart disease Paternal Grandmother   . Asthma Son   . Asthma Father   . Asthma Maternal Uncle   . Allergies Other        everyone    Her Social History Is Significant For: Social History   Socioeconomic History  . Marital status: Married    Spouse name: Not on file  . Number of children: 2  . Years of education: `  . Highest education level: Not on file  Occupational History  . Occupation: retired/disabled  Tobacco Use  . Smoking status: Never Smoker  . Smokeless tobacco: Never Used   Substance and Sexual Activity  . Alcohol use: No  . Drug use: No  . Sexual activity: Not on file  Other Topics Concern  . Not on file  Social History Narrative  . Not on file   Social Determinants of Health   Financial Resource Strain:   . Difficulty of Paying Living Expenses: Not on file  Food Insecurity:   . Worried About Charity fundraiser in the Last Year: Not on file  . Ran Out of Food in the Last Year: Not on file  Transportation Needs:   . Lack of Transportation (Medical): Not on file  . Lack of Transportation (Non-Medical): Not on file  Physical Activity:   . Days of Exercise per Week: Not on file  . Minutes of Exercise per Session: Not on file  Stress:   . Feeling of Stress :  Not on file  Social Connections:   . Frequency of Communication with Friends and Family: Not on file  . Frequency of Social Gatherings with Friends and Family: Not on file  . Attends Religious Services: Not on file  . Active Member of Clubs or Organizations: Not on file  . Attends Archivist Meetings: Not on file  . Marital Status: Not on file    Her Allergies Are:  No Known Allergies:   Her Current Medications Are:  Outpatient Encounter Medications as of 09/03/2020  Medication Sig  . aspirin 81 MG tablet Take 81 mg by mouth daily.  . Calcium Carb-Cholecalciferol (CALCIUM PLUS VITAMIN D3 PO) Take 1 tablet by mouth 2 (two) times daily.   . cetirizine (ZYRTEC) 10 MG tablet Take 10 mg by mouth daily.  Marland Kitchen gabapentin (NEURONTIN) 300 MG capsule Take 300 mg by mouth at bedtime.   . Multiple Vitamins-Minerals (MULTIVITAL PO) Take 1 tablet by mouth daily. Once a day   . oxyCODONE (ROXICODONE) 15 MG immediate release tablet Take 15 mg by mouth 4 (four) times daily as needed.  . polyethylene glycol (MIRALAX / GLYCOLAX) 17 g packet Take 17 g by mouth daily as needed for moderate constipation.   . pravastatin (PRAVACHOL) 40 MG tablet Take 40 mg by mouth daily.  Marland Kitchen  telmisartan-hydrochlorothiazide (MICARDIS HCT) 80-12.5 MG tablet Take 1 tablet by mouth daily.  . [DISCONTINUED] omeprazole (PRILOSEC) 20 MG capsule Take 20 mg by mouth daily.   No facility-administered encounter medications on file as of 09/03/2020.  :  Review of Systems:  Out of a complete 14 point review of systems, all are reviewed and negative with the exception of these symptoms as listed below: Review of Systems  Neurological:       Pt presents today to discuss her recent CT results.    Objective:  Neurological Exam  Physical Exam Physical Examination:   Vitals:   09/03/20 1255  BP: 121/72  Pulse: 73   General Examination: The patient is a very pleasant 64 y.o. female in no acute distress. She appears well-developed and well-nourished and well groomed.   HEENT: Normocephalic, atraumatic, pupils are equal, round and reactive to light. Extraocular tracking is good without limitation to gaze excursion or nystagmus noted. Normal smooth pursuit is noted. Hearing is grossly intact. Face is symmetric with normal facial animation and normal facial sensation. Speech is clear with no dysarthria noted. There is no hypophonia. There is no lip, neck/head, jaw or voice tremor. Neck is supple with full range of passive and active motion. There are no carotid bruits on auscultation. Oropharynx exam reveals: moderate mouth dryness, adequate dental hygiene. Tongue protrudes centrally and palate elevates symmetrically.   Chest: Clear to auscultation without wheezing, rhonchi or crackles noted.  Heart: S1+S2+0, regular and normal without murmurs, rubs or gallops noted.   Abdomen: Soft, non-tender and non-distended.  Extremities: There is non pitting puffiness in her distal legs. Edema in the distal legs.  Skin: Warm and dry without trophic changes noted.  Musculoskeletal: exam reveals bilateral knee pain and decreased ROM L knee.   Neurologically:  Mental status: The patient is  awake, alert and oriented in all 4 spheres. Her immediate and remote memory, attention, language skills and fund of knowledge are appropriate. There is no evidence of aphasia, agnosia, apraxia or anomia. Speech is clear with normal prosody and enunciation. Thought process is linear. Mood is normal and affect is normal.  Cranial nerves II - XII are as  described above under HEENT exam.  Motor exam: Normal bulk, strength and tone is noted. There is no tremor. Romberg is not tested d/t safety concerns. Reflexes are 1+ in the UEs and diminished in the LEs. Babinski: Toes are flexor bilaterally. Fine motor skills and coordination: Grossly intact.   Cerebellar testing: No dysmetria or intention tremor. There is no truncal or gait ataxia.  Sensory exam: intact to light touch in the upper and lower extremities.  Gait, station and balance: She stands with difficulty.  She has to push herself up. She walks w a single-point cane, no significant limp today but walks slowly and cautiously.              Assessment and Plan:   In summary, MILLER EDGINGTON is a very pleasant 64 year old female with an underlying medical history of hypertension, hyperlipidemia, cervical cancer, asthma, obstructive sleep apnea, and morbid obesity with a BMI of over 23, who presents for follow-up consultation of her right P1 stenosis that was found on a CT angiogram in the ER. She had presented with headache, abdominal pain, and nausea. She improved within a week's time of her symptoms. I asked her to consult with interventional radiology. She had subsequent imaging tests and her recent CT angiogram head and neck did not show any significant intracranial stenosis. 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 She is advised to follow-up with her primary care physician regarding this and a referral. She was told that she should see vascular surgery. She can call Dr.  Arlean Hopping office for better clarification. From my end of things, she is stable. She is advised to follow-up with her primary care physician from here on. She was advised to have a repeat CT angiogram head and neck in a years time, interventional radiology has documented that they will reach out to her for repeat testing routinely.  I answered all her questions today and the patient was in agreement with the above plan.

## 2020-09-03 NOTE — Patient Instructions (Signed)
Please call Dr. Arlean Hopping office as to clarification which surgeon to see for the incidental finding of the left carotid body tumor. It sounds to me as if they recommended vascular surgery to you but he also mentioned ENT, ear nose throat. Please also follow-up with your primary care physician. From my end of things, you can follow-up with your primary care physician and get referrals placed to see a specialist such as ENT or vascular surgery if needed. For further clarification, I am sure Dr. Arlean Hopping office or his PA can help you with guidance.

## 2020-09-26 ENCOUNTER — Encounter: Payer: Self-pay | Admitting: Cardiology

## 2020-09-26 ENCOUNTER — Ambulatory Visit: Payer: Medicare Other | Admitting: Cardiology

## 2020-09-26 ENCOUNTER — Other Ambulatory Visit: Payer: Self-pay

## 2020-09-26 VITALS — BP 103/38 | HR 61 | Resp 16 | Ht 59.0 in | Wt 211.0 lb

## 2020-09-26 DIAGNOSIS — I10 Essential (primary) hypertension: Secondary | ICD-10-CM

## 2020-09-26 DIAGNOSIS — Z6841 Body Mass Index (BMI) 40.0 and over, adult: Secondary | ICD-10-CM

## 2020-09-26 DIAGNOSIS — D446 Neoplasm of uncertain behavior of carotid body: Secondary | ICD-10-CM

## 2020-09-26 DIAGNOSIS — E7800 Pure hypercholesterolemia, unspecified: Secondary | ICD-10-CM

## 2020-09-26 NOTE — Progress Notes (Addendum)
Primary Physician/Referring:  Jilda Panda, MD  Patient ID: Cassandra Kelley, female    DOB: 30-Oct-1955, 64 y.o.   MRN: [id number]  Chief Complaint  Patient presents with  . Carotid body tumor  . New Patient (Initial Visit)    Referred by Dr. Mellody Drown   HPI:    Cassandra Kelley  is a 64 y.o. 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  There is no history to suggest tachycardia, palpitations, hypertensive episodes.  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  She is now referred to me for evaluation of same.  Past Medical History:  Diagnosis Date  . Asthma   . Cervical cancer ([place]) dx'd ~ 2005  . Chronic headaches   . Constipation   . High cholesterol   . Hypertension   . OSA (obstructive sleep apnea)    Past Surgical History:  Procedure Laterality Date  . CHOLECYSTECTOMY    . HEMORRHOID SURGERY    . hemrr    . IR ANGIO INTRA EXTRACRAN SEL COM CAROTID INNOMINATE BILAT MOD SED  02/16/2020  . IR ANGIO VERTEBRAL SEL VERTEBRAL BILAT MOD SED  02/16/2020  . IR US GUIDE VASC ACCESS RIGHT  02/16/2020  . TUBAL LIGATION    . VAGINAL HYSTERECTOMY     Family History  Problem Relation Age of Onset  . Lung cancer Mother   . Cancer Maternal Aunt   . Heart disease Maternal Grandfather   . Heart disease Maternal Grandmother   . Heart disease Paternal Grandfather   . Heart disease Paternal Grandmother   . Asthma Son   . Asthma Father   . Asthma Maternal Uncle   . Allergies Other        everyone    Social History   Tobacco Use  . Smoking status: Never Smoker  . Smokeless tobacco: Never Used  Substance Use Topics  .  Alcohol use: No   Marital Status: Married  ROS  Review of Systems  Cardiovascular: Negative for chest pain, dyspnea on exertion and leg swelling.  Musculoskeletal: Positive for arthritis.  Gastrointestinal: Positive for constipation. Negative for melena.   Objective  Blood pressure (!) 103/38, pulse 61, resp. rate 16, height 4\' 11"  (1.499 m), weight 211 lb (95.7 kg), SpO2 95 %.  Vitals with BMI 09/26/2020 09/03/2020 02/16/2020  Height 4\' 11"  4\' 11"  -  Weight 211 lbs 220 lbs -  BMI 22.97 98.92 -  Systolic 119 417 95  Diastolic 38 72 57  Pulse 61 73 61     Physical Exam Constitutional:      Comments: Morbidly obese in no acute distress.  Cardiovascular:     Rate and Rhythm: Regular rhythm. Bradycardia present.     Pulses:          Carotid pulses are 2+ on the right side and 2+ on the left side.      Dorsalis pedis pulses are 2+ on the right side and 2+ on the left side.       Posterior tibial pulses are 2+ on the right side and 2+ on the left side.     Heart sounds: Normal heart sounds. No murmur heard. No gallop.  Comments: Femoral and popliteal pulse difficult to feel due to patient's body habitus.  No leg edema. JVD difficult to see due to short neck. Pulmonary:     Effort: Pulmonary effort is normal.     Breath sounds: Normal breath sounds.  Abdominal:     General: Bowel sounds are normal.     Palpations: Abdomen is soft.     Comments: Obese. Pannus present    Laboratory examination:   Recent Labs    01/12/20 1343 02/16/20 0700 08/20/20 1621  NA 135 135  --   K 3.8 3.6  --   CL 98 99  --   CO2 25 27  --   GLUCOSE 107* 99  --   BUN 12 14  --   CREATININE 0.77 0.91 0.90  CALCIUM 9.6 9.5  --   GFRNONAA >60 >60  --   GFRAA >60 >60  --    CrCl cannot be calculated (Patient's most recent lab result is older than the maximum 21 days allowed.).  CMP Latest Ref Rng & Units 08/20/2020 02/16/2020 01/12/2020  Glucose 70 - 99 mg/dL - 99 107(H)  BUN 8 - 23 mg/dL - 14  12  Creatinine 0.44 - 1.00 mg/dL 0.90 0.91 0.77  Sodium 135 - 145 mmol/L - 135 135  Potassium 3.5 - 5.1 mmol/L - 3.6 3.8  Chloride 98 - 111 mmol/L - 99 98  CO2 22 - 32 mmol/L - 27 25  Calcium 8.9 - 10.3 mg/dL - 9.5 9.6  Total Protein 6.5 - 8.1 g/dL - - 7.5  Total Bilirubin 0.3 - 1.2 mg/dL - - 1.2  Alkaline Phos 38 - 126 U/L - - 86  AST 15 - 41 U/L - - 27  ALT 0 - 44 U/L - - 18   CBC Latest Ref Rng & Units 02/16/2020 01/12/2020 10/23/2014  WBC 4.0 - 10.5 K/uL 6.3 7.3 -  Hemoglobin 12.0 - 15.0 g/dL 13.0 13.7 14.6  Hematocrit 36.0 - 46.0 % 38.6 39.9 43.0  Platelets 150 - 400 K/uL 240 266 -    Lipid Panel No results for input(s): CHOL, TRIG, LDLCALC, VLDL, HDL, CHOLHDL, LDLDIRECT in the last 8760 hours.  HEMOGLOBIN A1C No results found for: HGBA1C, MPG TSH No results for input(s): TSH in the last 8760 hours.  External labs:    Medications and allergies  No Known Allergies   Outpatient Medications Prior to Visit  Medication Sig Dispense Refill  . aspirin 81 MG tablet Take 81 mg by mouth daily.    . Calcium Carb-Cholecalciferol (CALCIUM PLUS VITAMIN D3 PO) Take 1 tablet by mouth 2 (two) times daily.     . cetirizine (ZYRTEC) 10 MG tablet Take 10 mg by mouth daily.    Marland Kitchen esomeprazole (NEXIUM) 40 MG capsule     . gabapentin (NEURONTIN) 300 MG capsule Take 300 mg by mouth at bedtime.     . Multiple Vitamins-Minerals (MULTIVITAL PO) Take 1 tablet by mouth daily. Once a day     . oxyCODONE (ROXICODONE) 15 MG immediate release tablet Take 15 mg by mouth 4 (four) times daily as needed.    . polyethylene glycol (MIRALAX / GLYCOLAX) 17 g packet Take 17 g by mouth daily as needed for moderate constipation.     . pravastatin (PRAVACHOL) 40 MG tablet Take 40 mg by mouth daily.    Marland Kitchen telmisartan-hydrochlorothiazide (MICARDIS HCT) 80-12.5 MG tablet Take 1 tablet by mouth daily.     No facility-administered medications prior to visit.  Radiology:   CT angiogram of the head and neck with  and without contrast 01/12/2020: 1. Normalized appearance of the right PCA P1/P2 junction compared to the prior CTA. No significant PCA stenosis. And elsewhere minimal plaque with no arterial stenosis in the head or neck.  2. 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 Recommend follow-up with ENT.  3. Stable CT appearance of the brain. 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  CT abdomen 01/08/2020: 1. Normal appendix. No explanation for right lower quadrant pain. 2. Postcholecystectomy with intra and mild extrahepatic biliary ductal dilatation. This may be normal for this patient, and appears similar to 2015 exam allowing for differences in technique. No further evaluation is recommended given normal LFTs. 3. Mild wall thickening of the distal esophagus, can be seen with reflux or esophagitis. 4. Fat containing umbilical and lower ventral abdominal wall hernias.  Cardiac Studies:   Echo 02/18/12; Normal LVEF. Grade II diastolic dysfunction. Mild AI. Trace MR. MV and AV thickening.  Lexiscan sestamibi 02/16/12: SR, low voltage complex. Prominent gut uptake artifact. EF 59%. Low risk  Sleep study 02/10/12:Mild sleep apnea. Split study not done.  EKG:    EKG 09/26/2020: Sinus bradycardia heart rate of 59 bpm, borderline left atrial abnormality, incomplete right bundle branch block.  Poor R wave progression, cannot exclude anteroseptal infarct old.  Low-voltage complexes.  Pulmonary disease pattern. No change from 02/09/2012.     Assessment     ICD-10-CM   1. Carotid body tumor (HCC)  D44.6 EKG 12-Lead    Metanephrines, urine, 24 hour    Catecholamine+VMA, 24-Hr Urine    PCV CAROTID DUPLEX (BILATERAL)  2. Primary hypertension  I10 Metanephrines, urine, 24 hour    Catecholamine+VMA, 24-Hr Urine  3. Hypercholesteremia  E78.00   4. Class 3 severe obesity due to excess calories without  serious comorbidity with body mass index (BMI) of 40.0 to 44.9 in adult (HCC)  E66.01    Z68.41     There are no discontinued medications.  No orders of the defined types were placed in this encounter.   Recommendations:   Cassandra Kelley is a 64 y.o. 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  There is no history to suggest tachycardia, palpitations, hypertensive episodes.  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  She is now referred to me for evaluation of same.  She has not had any further headaches, there is no history to suggest that this is an active paraganglioma.  No indication for performing biopsy as usually these tumors are extremely vascular. I personally reviewed her CT angiograms and called and discussed with Radiologist.  In the absence of any other symptoms, I have not recommended any other further evaluation. Wait and watch for symptoms as noted above.  She is already 64 years of age and hence familial type paragangliomas are extremely rare.  She has no other symptoms to also suggest any other neuroendocrine tumors, usually paragangliomas in other regions including abdomen, chest are noted.  I reviewed her chart,  along with abdominal CT, there is no obvious abnormality especially  along the adrenal gland.   Otherwise from cardiac standpoint, blood pressure is well controlled, lipids being managed by PCP, obesity and arthritis continues to be her major issue.   This was a 70 minutes of OV and evaluation of external records, evaluation and management of complex medical issues and coordination of care.    Addendum: UpToDate: The diagnosis of a secretory paraganglioma is typically made by measurements of urinary and/or plasma fractionated metanephrines and catecholamines (algorithm 1). However, biochemical testing is indicated for all paragangliomas, even if clinically non-functional.   Will order 24-hour urinary catecholamine testing. Will also obtain Carotid artery duplex        Adrian Prows, MD, [facility] 09/30/2020, 12:40 PM Office: [phone]  CC: Drs. Lucienne Capers, Levy Sjogren.

## 2020-09-28 ENCOUNTER — Encounter: Payer: Self-pay | Admitting: Cardiology

## 2020-09-28 NOTE — Addendum Note (Signed)
Addended by: Kela Millin on: 09/28/2020 10:09 AM   Modules accepted: Orders

## 2020-09-30 NOTE — Addendum Note (Signed)
Addended by: Kela Millin on: 09/30/2020 12:40 PM   Modules accepted: Orders

## 2020-10-05 ENCOUNTER — Ambulatory Visit: Payer: Medicare Other

## 2020-10-05 ENCOUNTER — Other Ambulatory Visit: Payer: Self-pay

## 2020-10-05 DIAGNOSIS — D446 Neoplasm of uncertain behavior of carotid body: Secondary | ICD-10-CM

## 2020-10-08 NOTE — Progress Notes (Signed)
Just FYI, essentially normal carotid duplex.

## 2020-10-10 ENCOUNTER — Telehealth (HOSPITAL_COMMUNITY): Payer: Self-pay | Admitting: Radiology

## 2020-10-10 ENCOUNTER — Other Ambulatory Visit (HOSPITAL_COMMUNITY): Payer: Self-pay | Admitting: Internal Medicine

## 2020-10-10 DIAGNOSIS — D446 Neoplasm of uncertain behavior of carotid body: Secondary | ICD-10-CM

## 2020-10-10 NOTE — Telephone Encounter (Signed)
Dr. Einar Gip sent an in box message to Dr. Estanislado Pandy concerning Cassandra Kelley's finding of a carotid body tumor. Dr. Estanislado Pandy recommends patient see vascular surgery for evaluation. Deveshwar will also continue to follow patient with imaging studies for this as well as stenosis. An in box message with this information was sent back to Dr. Einar Gip, MD today. JM

## 2020-10-11 DIAGNOSIS — I10 Essential (primary) hypertension: Secondary | ICD-10-CM

## 2020-10-11 DIAGNOSIS — D446 Neoplasm of uncertain behavior of carotid body: Secondary | ICD-10-CM

## 2020-10-11 LAB — METANEPHRINES, URINE, 24 HOUR
Metaneph Total, Ur: 125 ug/L
Metanephrines, 24H Ur: 113 ug/24 hr (ref 36–209)
Normetanephrine, 24H Ur: 203 ug/24 hr (ref 131–612)
Normetanephrine, Ur: 225 ug/L

## 2020-10-11 LAB — CATECHOLAMINE+VMA, 24-HR URINE
Dopamine , 24H Ur: 102 ug/24 hr (ref 0–510)
Dopamine, Rand Ur: 113 ug/L
Epinephrine, 24H Ur: 4 ug/24 hr (ref 0–20)
Epinephrine, Rand Ur: 4 ug/L
Norepinephrine, 24H Ur: 23 ug/24 hr (ref 0–135)
Norepinephrine, Rand Ur: 26 ug/L
VMA, 24H Ur Adult: 4.5 mg/24 hr (ref 0.0–7.5)
VMA, Urine: 5 mg/L

## 2020-10-17 NOTE — Telephone Encounter (Signed)
Will refer her for oncology evaluation for carotid body tumor per patient request.    ICD-10-CM   1. Carotid body tumor (HCC)  D44.6 Ambulatory referral to Oncology  2. Primary hypertension  I10 Ambulatory referral to Oncology     Yates Decamp, MD, [facility] 10/17/2020, 1:57 PM Office: [phone] Pager: [phone]

## 2020-10-23 ENCOUNTER — Other Ambulatory Visit: Payer: Medicare Other

## 2020-11-08 ENCOUNTER — Ambulatory Visit: Payer: Medicare Other | Admitting: Cardiology

## 2020-11-09 DIAGNOSIS — D446 Neoplasm of uncertain behavior of carotid body: Secondary | ICD-10-CM

## 2020-11-09 DIAGNOSIS — I10 Essential (primary) hypertension: Secondary | ICD-10-CM

## 2020-11-09 NOTE — Telephone Encounter (Signed)
Primary Physician/Referring:  Jilda Panda, MD  Patient ID: Cassandra Kelley, female    DOB: 24-Dec-1955, 65 y.o.   MRN: [id number]  No chief complaint on file.  HPI:    Cassandra Kelley  is a 65 y.o. 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  There is no history to suggest tachycardia, palpitations, hypertensive episodes.  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   This is a telephone encounter, patient requesting referral to [facility] endocrinology.  Past Medical History:  Diagnosis Date  . Asthma   . Cervical cancer ([place]) dx'd ~ 2005  . Chronic headaches   . Constipation   . High cholesterol   . Hypertension   . OSA (obstructive sleep apnea)    Past Surgical History:  Procedure Laterality Date  . CHOLECYSTECTOMY    . HEMORRHOID SURGERY    . hemrr    . IR ANGIO INTRA EXTRACRAN SEL COM CAROTID INNOMINATE BILAT MOD SED  02/16/2020  . IR ANGIO VERTEBRAL SEL VERTEBRAL BILAT MOD SED  02/16/2020  . IR US GUIDE VASC ACCESS RIGHT  02/16/2020  . TUBAL LIGATION    . VAGINAL HYSTERECTOMY     Family History  Problem Relation Age of Onset  . Lung cancer Mother   . Cancer Maternal Aunt   . Heart disease Maternal Grandfather   . Heart disease Maternal Grandmother   . Heart disease Paternal Grandfather   . Heart disease Paternal Grandmother   . Asthma Son   . Asthma Father   . Asthma Maternal Uncle   . Allergies Other        everyone    Social History   Tobacco Use  . Smoking status: Never Smoker  . Smokeless tobacco: Never Used  Substance Use Topics  . Alcohol use: No   Marital Status: Married  ROS  Review  of Systems  Cardiovascular: Negative for chest pain, dyspnea on exertion and leg swelling.  Musculoskeletal: Positive for arthritis.  Gastrointestinal: Positive for constipation. Negative for melena.   Objective  Blood pressure (!) 103/38, pulse 61, resp. rate 16, height 4\' 11"  (1.499 m), weight 211 lb (95.7 kg), SpO2 95 %.  Vitals with BMI 09/26/2020 09/03/2020 02/16/2020  Height 4\' 11"  4\' 11"  -  Weight 211 lbs 220 lbs -  BMI [id number] -  Systolic [id number] 95  Diastolic 38 72 57  Pulse 61 73 61  Prior examination is as below.  Physical Exam Constitutional:      Comments: Morbidly obese in no acute distress.  Cardiovascular:     Rate and Rhythm: Regular rhythm. Bradycardia present.     Pulses:          Carotid pulses are 2+ on the right side and 2+ on the left side.      Dorsalis pedis pulses are 2+ on the right side and 2+ on the left side.       Posterior tibial pulses are 2+ on the right side and 2+ on the left side.     Heart sounds: Normal heart sounds. No murmur heard. No gallop.      Comments: Femoral and popliteal pulse difficult to feel due to patient's body  habitus.  No leg edema. JVD difficult to see due to short neck. Pulmonary:     Effort: Pulmonary effort is normal.     Breath sounds: Normal breath sounds.  Abdominal:     General: Bowel sounds are normal.     Palpations: Abdomen is soft.     Comments: Obese. Pannus present    Laboratory examination:   Recent Labs    01/12/20 1343 02/16/20 0700 08/20/20 1621  NA 135 135  --   K 3.8 3.6  --   CL 98 99  --   CO2 25 27  --   GLUCOSE 107* 99  --   BUN 12 14  --   CREATININE 0.77 0.91 0.90  CALCIUM 9.6 9.5  --   GFRNONAA >60 >60  --   GFRAA >60 >60  --    CrCl cannot be calculated (Patient's most recent lab result is older than the maximum 21 days allowed.).  CMP Latest Ref Rng & Units 08/20/2020 02/16/2020 01/12/2020  Glucose 70 - 99 mg/dL - 99 107(H)  BUN 8 - 23 mg/dL - 14 12  Creatinine 0.44 - 1.00  mg/dL 0.90 0.91 0.77  Sodium 135 - 145 mmol/L - 135 135  Potassium 3.5 - 5.1 mmol/L - 3.6 3.8  Chloride 98 - 111 mmol/L - 99 98  CO2 22 - 32 mmol/L - 27 25  Calcium 8.9 - 10.3 mg/dL - 9.5 9.6  Total Protein 6.5 - 8.1 g/dL - - 7.5  Total Bilirubin 0.3 - 1.2 mg/dL - - 1.2  Alkaline Phos 38 - 126 U/L - - 86  AST 15 - 41 U/L - - 27  ALT 0 - 44 U/L - - 18   CBC Latest Ref Rng & Units 02/16/2020 01/12/2020 10/23/2014  WBC 4.0 - 10.5 K/uL 6.3 7.3 -  Hemoglobin 12.0 - 15.0 g/dL 13.0 13.7 14.6  Hematocrit 36.0 - 46.0 % 38.6 39.9 43.0  Platelets 150 - 400 K/uL 240 266 -    Lipid Panel No results for input(s): CHOL, TRIG, LDLCALC, VLDL, HDL, CHOLHDL, LDLDIRECT in the last 8760 hours.  HEMOGLOBIN A1C No results found for: HGBA1C, MPG TSH No results for input(s): TSH in the last 8760 hours.     Ref Range & Units 10/05/2020  Normetanephrine, Ur Undefined ug/L 225   Normetanephrine, 24H Ur 131 - 612 ug/24 hr 203   Metaneph Total, Ur Undefined ug/L 125   Metanephrines, 24H Ur 36 - 209 ug/24 hr 113   Resulting Agency  [facility]     TSH normal   Medications and allergies  No Known Allergies   Outpatient Medications Prior to Visit  Medication Sig Dispense Refill  . aspirin 81 MG tablet Take 81 mg by mouth daily.    . Calcium Carb-Cholecalciferol (CALCIUM PLUS VITAMIN D3 PO) Take 1 tablet by mouth 2 (two) times daily.     . cetirizine (ZYRTEC) 10 MG tablet Take 10 mg by mouth daily.    Marland Kitchen esomeprazole (NEXIUM) 40 MG capsule     . gabapentin (NEURONTIN) 300 MG capsule Take 300 mg by mouth at bedtime.     . Multiple Vitamins-Minerals (MULTIVITAL PO) Take 1 tablet by mouth daily. Once a day     . oxyCODONE (ROXICODONE) 15 MG immediate release tablet Take 15 mg by mouth 4 (four) times daily as needed.    . polyethylene glycol (MIRALAX / GLYCOLAX) 17 g packet Take 17 g by mouth daily as needed for moderate constipation.     Marland Kitchen  pravastatin (PRAVACHOL) 40 MG tablet Take 40 mg by mouth daily.    Marland Kitchen  telmisartan-hydrochlorothiazide (MICARDIS HCT) 80-12.5 MG tablet Take 1 tablet by mouth daily.     No facility-administered medications prior to visit.    Radiology:   CT angiogram of the head and neck with and without contrast 01/12/2020: 1. Normalized appearance of the right PCA P1/P2 junction compared to the prior CTA. No significant PCA stenosis. And elsewhere minimal plaque with no arterial stenosis in the head or neck.  2. 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 Recommend follow-up with ENT.  3. Stable CT appearance of the brain. 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  CT abdomen 01/08/2020: 1. Normal appendix. No explanation for right lower quadrant pain. 2. Postcholecystectomy with intra and mild extrahepatic biliary ductal dilatation. This may be normal for this patient, and appears similar to 2015 exam allowing for differences in technique. No further evaluation is recommended given normal LFTs. 3. Mild wall thickening of the distal esophagus, can be seen with reflux or esophagitis. 4. Fat containing umbilical and lower ventral abdominal wall hernias.  Cardiac Studies:   Echo 02/18/12; Normal LVEF. Grade II diastolic dysfunction. Mild AI. Trace MR. MV and AV thickening.  Lexiscan sestamibi 02/16/12: SR, low voltage complex. Prominent gut uptake artifact. EF 59%. Low risk  Sleep study 02/10/12:Mild sleep apnea. Split study not done.  EKG:    EKG 09/26/2020: Sinus bradycardia heart rate of 59 bpm, borderline left atrial abnormality, incomplete right bundle branch block.  Poor R wave progression, cannot exclude anteroseptal infarct old.  Low-voltage complexes.  Pulmonary disease pattern. No change from 02/09/2012.     Assessment     ICD-10-CM   1. Carotid body tumor ([place])  D44.6 Ambulatory referral to Endocrinology  2. Primary hypertension  I10 Ambulatory referral to  Endocrinology    There are no discontinued medications.  No orders of the defined types were placed in this encounter.  Orders Placed This Encounter  Procedures  . Ambulatory referral to Endocrinology    Referral Priority:   Routine    Referral Type:   Consultation    Referral Reason:   Specialty Services Required    Referral Location:   [facility]    Number of Visits Requested:   1     Recommendations:   Cassandra Kelley is a 65 y.o. 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  There is no history to suggest tachycardia, palpitations, hypertensive episodes.  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  She is now referred to me for evaluation of same.  She has not had any further headaches, there is no history to suggest that this is an active paraganglioma.  No indication for performing biopsy as usually these tumors are extremely vascular. I personally reviewed her CT angiograms and called and discussed with Radiologist.  In the absence of any other symptoms, I have not recommended any other further evaluation. Wait and watch for symptoms as noted above.  She is already 65 years of age and hence familial type paragangliomas are extremely rare.  She has no other symptoms to also suggest any other neuroendocrine tumors, usually paragangliomas in other regions including abdomen, chest are noted.  I reviewed her chart,  along with abdominal CT, there is no obvious abnormality especially along the adrenal gland.   Otherwise from cardiac standpoint, blood pressure is well controlled, lipids being managed by  PCP, obesity and arthritis continues to be her major issue.   Urinary metanephrines normal. Pet patient preference will refer to [facility] endocrinology    Adrian Prows, MD, [facility] 11/09/2020, 9:37 AM Office: [phone]

## 2021-06-20 ENCOUNTER — Other Ambulatory Visit: Payer: Self-pay | Admitting: Internal Medicine

## 2021-06-20 DIAGNOSIS — R634 Abnormal weight loss: Secondary | ICD-10-CM

## 2021-06-20 DIAGNOSIS — R110 Nausea: Secondary | ICD-10-CM

## 2021-06-27 ENCOUNTER — Ambulatory Visit
Admission: RE | Admit: 2021-06-27 | Discharge: 2021-06-27 | Disposition: A | Discharge status: Home / Self Care | Payer: Medicare Other | Source: Ambulatory Visit | Attending: Internal Medicine | Admitting: Internal Medicine

## 2021-06-27 DIAGNOSIS — R634 Abnormal weight loss: Secondary | ICD-10-CM

## 2021-06-27 DIAGNOSIS — R110 Nausea: Secondary | ICD-10-CM

## 2021-10-01 ENCOUNTER — Telehealth (HOSPITAL_COMMUNITY): Payer: Self-pay

## 2021-10-01 NOTE — Telephone Encounter (Signed)
Called to schedule cta head/neck. Pt is doing her f/u's at [facility] now. AW

## 2022-02-19 IMAGING — US US ABDOMEN COMPLETE
1 series · 13 of 25 positions shown · non-contrast
Comparison: January 12, 2020

CLINICAL DATA: nausea; weight loss

EXAM:
ABDOMEN ULTRASOUND COMPLETE

[Series 1: us abdomen complete · 0.23mm/px · 13 of 73 slices shown]
[im 1/73]
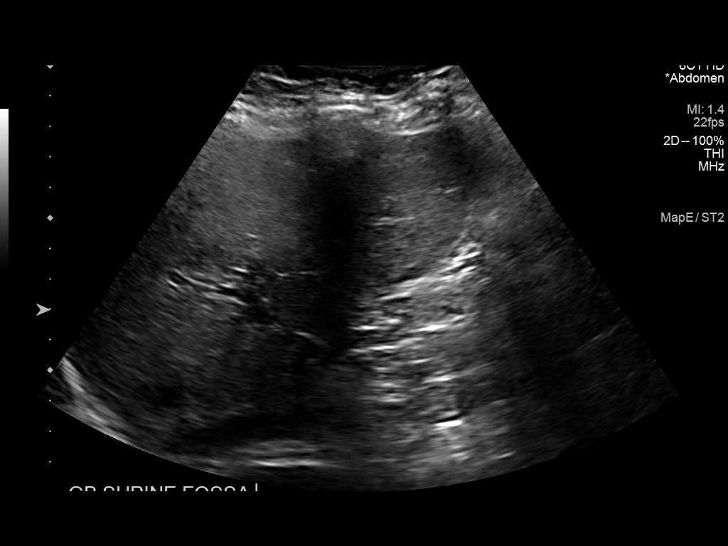
[im 7/73]
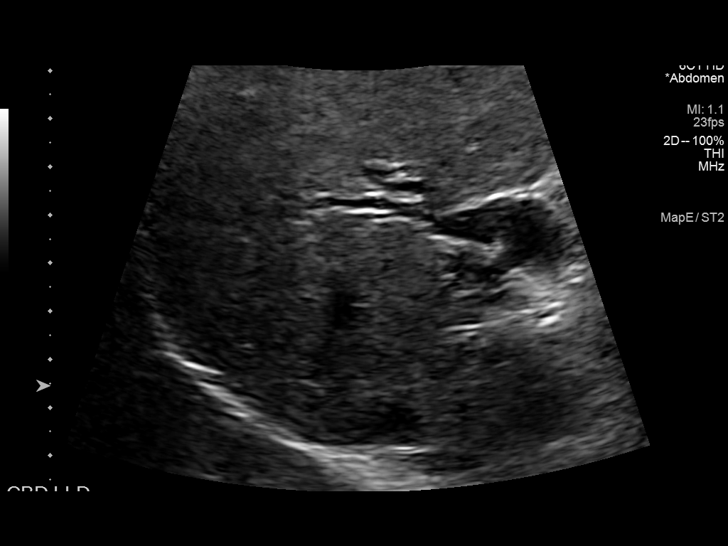
[im 13/73]
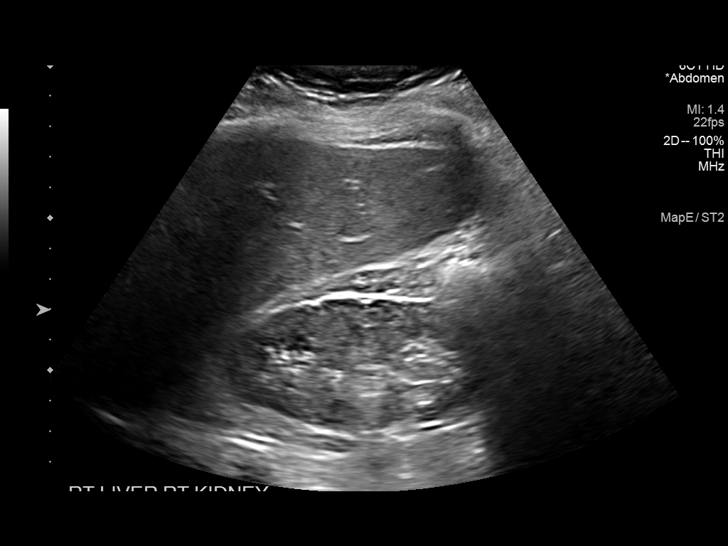
[im 19/73]
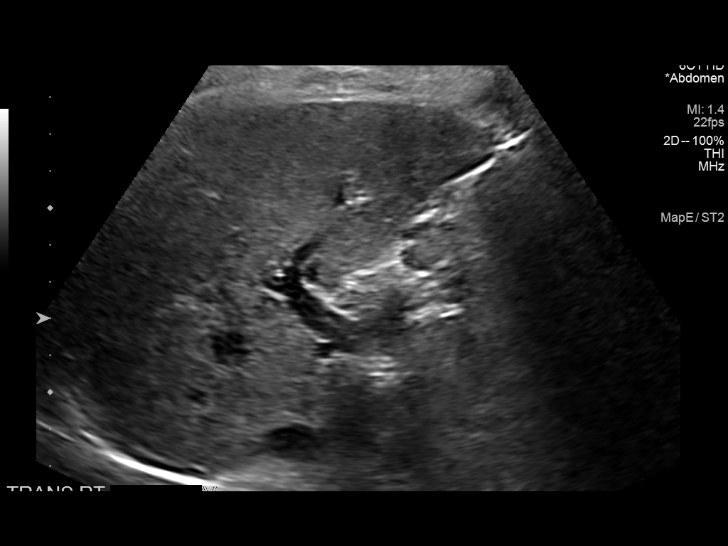
[im 25/73]
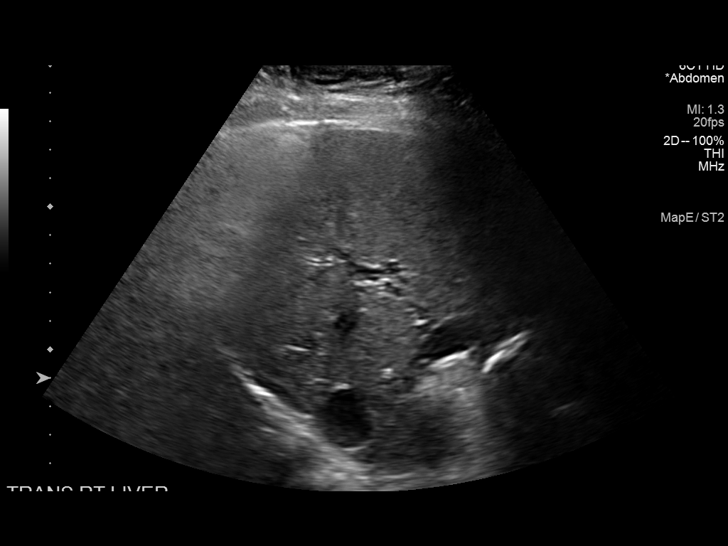
[im 31/73]
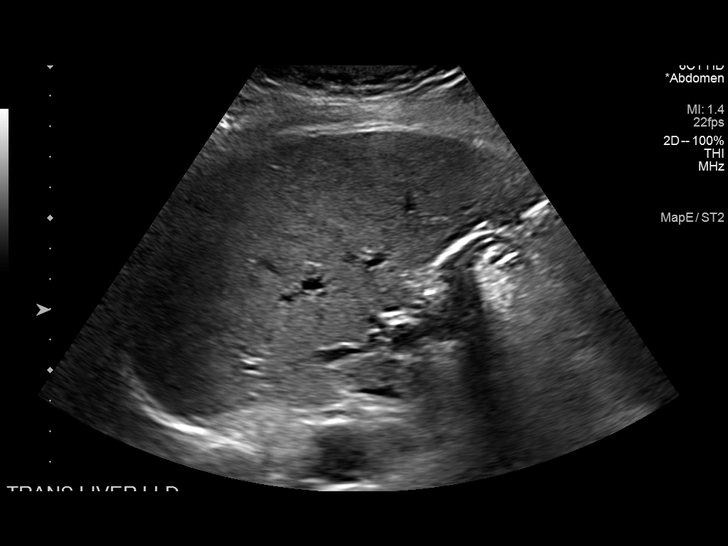
[im 37/73]
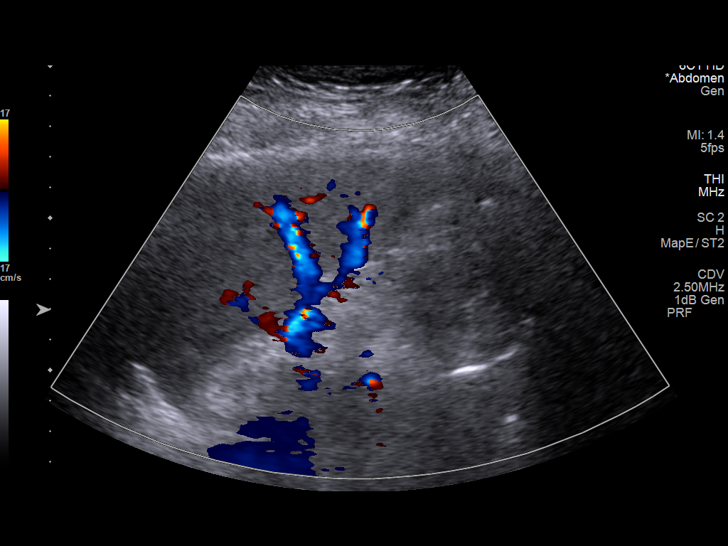
[im 43/73]
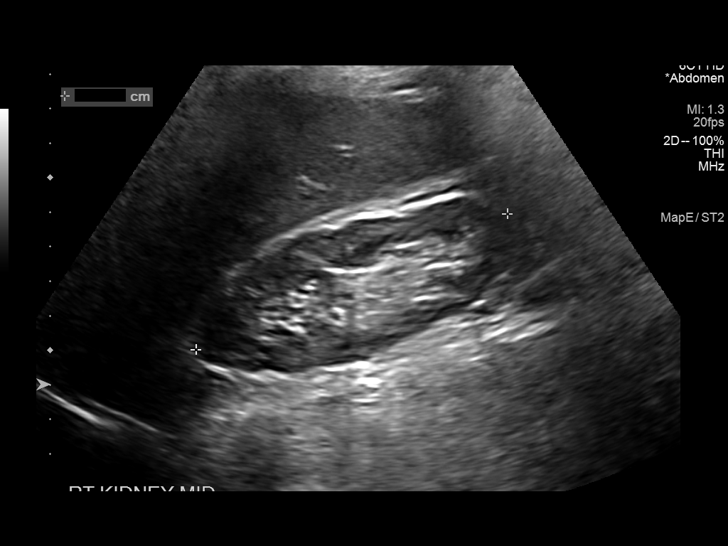
[im 49/73]
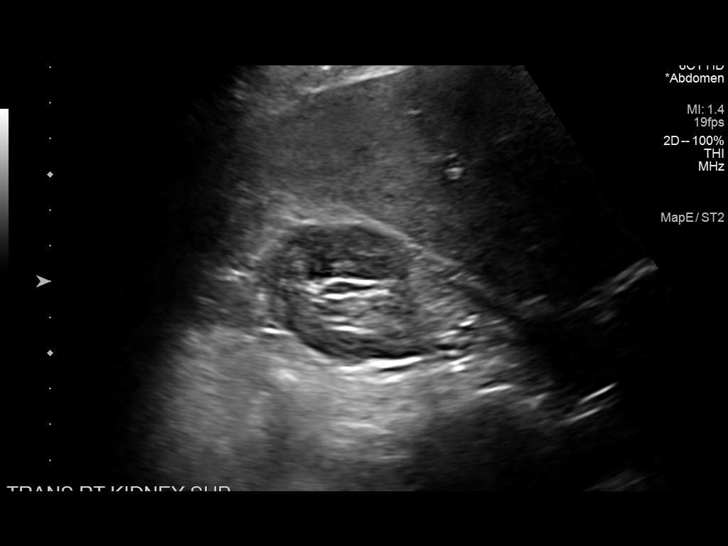
[im 55/73]
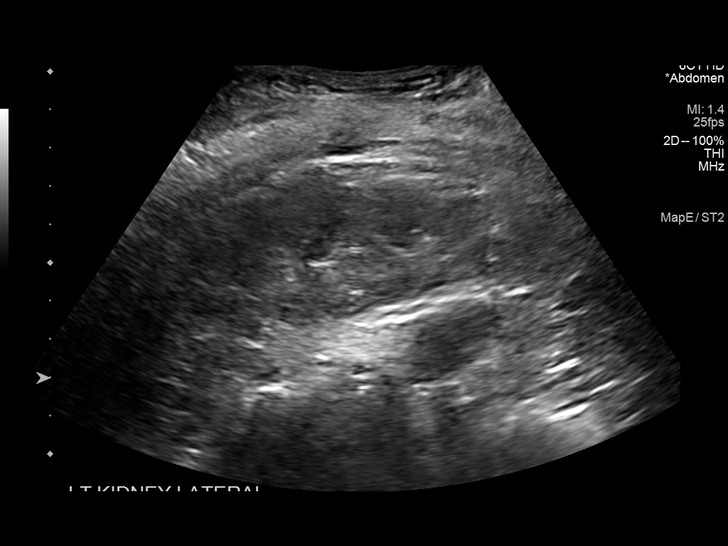
[im 61/73]
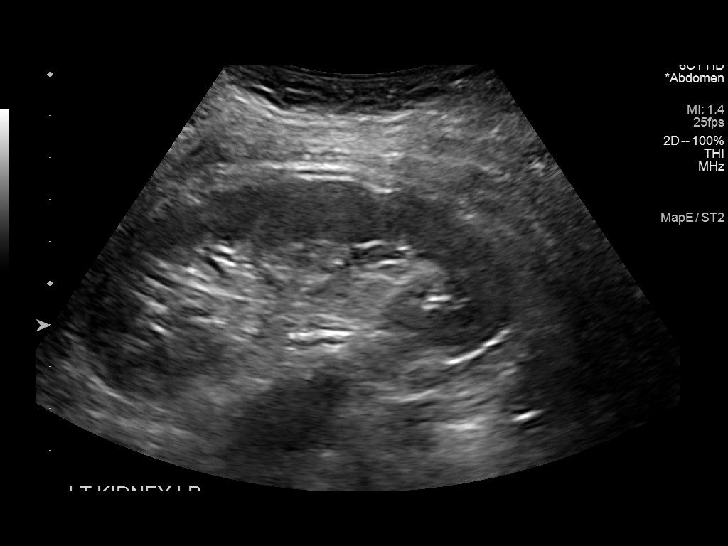
[im 67/73]
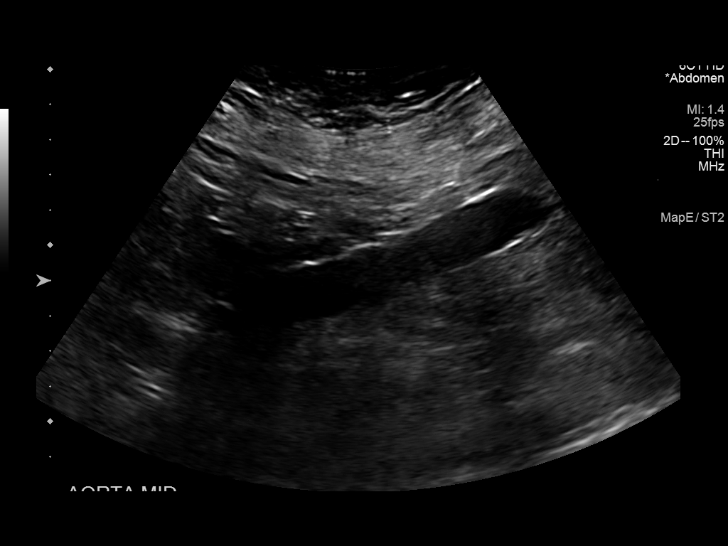
[im 73/73]
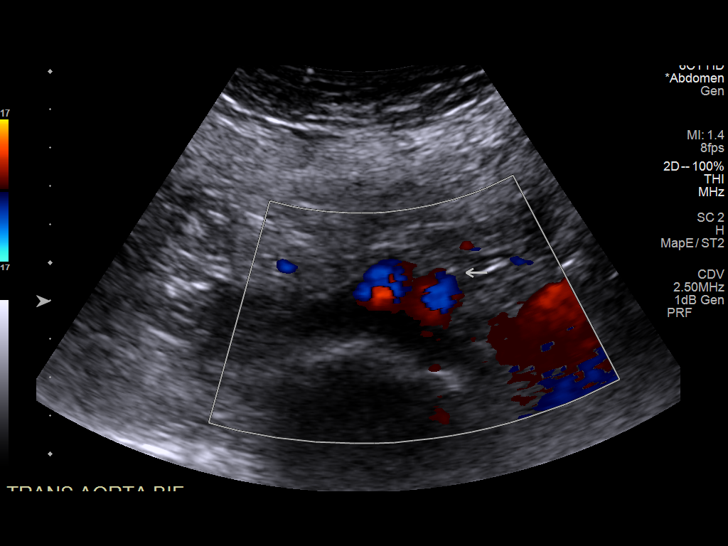

[13 of 25 positions shown; findings below may reference images not displayed]

FINDINGS: Gallbladder: Surgically absent.

Common bile duct: Diameter: 13 mm, similar comparison to prior and
likely due to post cholecystectomy state.

Liver: No focal lesion identified. Heterogeneous parenchymal
echogenicity. Portal vein is patent on color Doppler imaging with
normal direction of blood flow towards the liver.

IVC: No abnormality visualized.

Pancreas: Visualized portion unremarkable. Limited evaluation
secondary to shadowing bowel gas.

Spleen: Measures the upper limits of normal at 8.1 x 12.1 x 6.2 cm
for a volume of 314 ML.

Right Kidney: Length: 9.8 cm. Echogenicity within normal limits. No
mass or hydronephrosis visualized. Unchanged appearance of a
prominent renal pelvis.

Left Kidney: Length: 10.3 cm. Echogenicity within normal limits. No
mass or hydronephrosis visualized.

Abdominal aorta: No aneurysm visualized.

Other findings: None.
IMPRESSION: 1.  No sonographic etiology for weight loss identified.

2. Unchanged dilated common bile duct, likely due to post
cholecystectomy state. Recommend correlation with liver function
tests.

## 2022-10-03 ENCOUNTER — Other Ambulatory Visit: Payer: Self-pay | Admitting: Internal Medicine

## 2022-10-03 DIAGNOSIS — E8940 Asymptomatic postprocedural ovarian failure: Secondary | ICD-10-CM

## 2022-10-07 LAB — COLOGUARD: COLOGUARD: NEGATIVE

## 2022-11-24 ENCOUNTER — Emergency Department (HOSPITAL_COMMUNITY): Payer: Medicare Other

## 2022-11-24 ENCOUNTER — Emergency Department (HOSPITAL_COMMUNITY)
Admission: EM | Admit: 2022-11-24 | Discharge: 2022-11-24 | Disposition: A | Discharge status: Home / Self Care | Payer: Medicare Other | Attending: Emergency Medicine | Admitting: Emergency Medicine

## 2022-11-24 DIAGNOSIS — R072 Precordial pain: Secondary | ICD-10-CM | POA: Diagnosis not present

## 2022-11-24 DIAGNOSIS — R110 Nausea: Secondary | ICD-10-CM | POA: Insufficient documentation

## 2022-11-24 DIAGNOSIS — Z7982 Long term (current) use of aspirin: Secondary | ICD-10-CM | POA: Insufficient documentation

## 2022-11-24 DIAGNOSIS — R1013 Epigastric pain: Secondary | ICD-10-CM | POA: Insufficient documentation

## 2022-11-24 DIAGNOSIS — R079 Chest pain, unspecified: Secondary | ICD-10-CM | POA: Diagnosis present

## 2022-11-24 LAB — CBC WITH DIFFERENTIAL/PLATELET
Abs Immature Granulocytes: 0.01 10*3/uL (ref 0.00–0.07)
Basophils Absolute: 0 10*3/uL (ref 0.0–0.1)
Basophils Relative: 1 %
Eosinophils Absolute: 0.3 10*3/uL (ref 0.0–0.5)
Eosinophils Relative: 6 %
HCT: 38 % (ref 36.0–46.0)
Hemoglobin: 12.8 g/dL (ref 12.0–15.0)
Immature Granulocytes: 0 %
Lymphocytes Relative: 25 %
Lymphs Abs: 1.3 10*3/uL (ref 0.7–4.0)
MCH: 31.2 pg (ref 26.0–34.0)
MCHC: 33.7 g/dL (ref 30.0–36.0)
MCV: 92.7 fL (ref 80.0–100.0)
Monocytes Absolute: 0.5 10*3/uL (ref 0.1–1.0)
Monocytes Relative: 10 %
Neutro Abs: 3.2 10*3/uL (ref 1.7–7.7)
Neutrophils Relative %: 58 %
Platelets: 188 10*3/uL (ref 150–400)
RBC: 4.1 MIL/uL (ref 3.87–5.11)
RDW: 12.8 % (ref 11.5–15.5)
WBC: 5.5 10*3/uL (ref 4.0–10.5)
nRBC: 0 % (ref 0.0–0.2)

## 2022-11-24 LAB — COMPREHENSIVE METABOLIC PANEL
ALT: 13 U/L (ref 0–44)
AST: 22 U/L (ref 15–41)
Albumin: 3.5 g/dL (ref 3.5–5.0)
Alkaline Phosphatase: 67 U/L (ref 38–126)
Anion gap: 8 (ref 5–15)
BUN: 24 mg/dL — ABNORMAL HIGH (ref 8–23)
CO2: 26 mmol/L (ref 22–32)
Calcium: 8.6 mg/dL — ABNORMAL LOW (ref 8.9–10.3)
Chloride: 106 mmol/L (ref 98–111)
Creatinine, Ser: 0.9 mg/dL (ref 0.44–1.00)
GFR, Estimated: >60 mL/min (ref >60)
Glucose, Bld: 93 mg/dL (ref 70–99)
Potassium: 3.8 mmol/L (ref 3.5–5.1)
Sodium: 140 mmol/L (ref 135–145)
Total Bilirubin: 0.9 mg/dL (ref 0.3–1.2)
Total Protein: 6.6 g/dL (ref 6.5–8.1)

## 2022-11-24 LAB — LIPASE, BLOOD: Lipase: 30 U/L (ref 11–51)

## 2022-11-24 LAB — TROPONIN I (HIGH SENSITIVITY): Troponin I (High Sensitivity): 9 ng/L (ref <18)

## 2022-11-24 MED ORDER — FAMOTIDINE IN NACL 20-0.9 MG/50ML-% IV SOLN
20.0000 mg | Freq: Once | INTRAVENOUS | Status: AC
  Administered 2022-11-24: 20 mg via INTRAVENOUS
  Filled 2022-11-24: qty 50

## 2022-11-24 MED ORDER — ALUM & MAG HYDROXIDE-SIMETH 200-200-20 MG/5ML PO SUSP
30.0000 mL | Freq: Once | ORAL | Status: AC
  Administered 2022-11-24: 30 mL via ORAL
  Filled 2022-11-24: qty 30

## 2022-11-24 NOTE — ED Provider Notes (Signed)
[facility] Provider Note   CSN: [id number] Arrival date & time: 11/24/22  1134     History  Chief Complaint  Patient presents with   Chest Pain   Nausea    Cassandra Kelley is a 67 y.o. female presented to ED with epigastric pain.  Patient reports has been having symptoms for about a week, worse after eating, but significantly worse when laying flat overnight.  She was reports that she is feeling substernal pressure which was more intense this morning, particular eating blueberry muffin, but is eased up since.  She has a history of hiatal hernia and also has esophageal strictures that required dilatation.  She denies history of MI, hypertension, smoking, coronary disease.   HPI     Home Medications Prior to Admission medications   Medication Sig Start Date End Date Taking? Authorizing Provider  aspirin 81 MG tablet Take 81 mg by mouth daily.   Yes [provider]  Aspirin-Salicylamide-Caffeine (BC HEADACHE PO) Take 1 Package by mouth as needed (headaches).   Yes [provider]  aspirin-sod bicarb-citric acid (ALKA-SELTZER) 325 MG TBEF tablet Take 325 mg by mouth every 6 (six) hours as needed (prn nausea).   Yes [provider]  Calcium Carb-Cholecalciferol (CALCIUM PLUS VITAMIN D3 PO) Take 1 tablet by mouth 2 (two) times daily.    Yes [provider]  cetirizine (ZYRTEC) 10 MG tablet Take 10 mg by mouth daily.   Yes [provider]  Multiple Vitamins-Minerals (MULTIVITAL PO) Take 1 tablet by mouth daily. Once a day    Yes [provider]  omeprazole (PRILOSEC) 40 MG capsule Take 40 mg by mouth daily.   Yes [provider]  oxyCODONE (ROXICODONE) 15 MG immediate release tablet Take 15 mg by mouth 4 (four) times daily as needed. 02/03/20  Yes [provider]  polyethylene glycol (MIRALAX / GLYCOLAX) 17 g packet Take 17 g by mouth daily as needed for moderate  constipation.    Yes [provider]  pravastatin (PRAVACHOL) 40 MG tablet Take 40 mg by mouth daily.   Yes [provider]  Specialty Vitamins Products (MENOPAUSE SUPPORT PO) Take 1 tablet by mouth daily.   Yes [provider]  telmisartan-hydrochlorothiazide (MICARDIS HCT) 80-12.5 MG tablet Take 0.5 tablets by mouth daily.   Yes [provider]  esomeprazole (NEXIUM) 40 MG capsule  09/17/20   [provider]  gabapentin (NEURONTIN) 300 MG capsule Take 300 mg by mouth at bedtime.     [provider]      Allergies    Patient has no known allergies.    Review of Systems   Review of Systems  Physical Exam Updated Vital Signs BP 133/69 (BP Location: Right Arm)   Pulse 60   Temp 97.6 F (36.4 C) (Oral)   Resp 13   Ht '4\' 11"'$  (1.499 m)   Wt 86.2 kg   SpO2 100%   BMI 38.38 kg/m  Physical Exam Constitutional:      General: She is not in acute distress. HENT:     Head: Normocephalic and atraumatic.  Eyes:     Conjunctiva/sclera: Conjunctivae normal.     Pupils: Pupils are equal, round, and reactive to light.  Cardiovascular:     Rate and Rhythm: Normal rate and regular rhythm.  Pulmonary:     Effort: Pulmonary effort is normal. No respiratory distress.  Abdominal:     General: There is no distension.  Tenderness: There is no abdominal tenderness.  Skin:    General: Skin is warm and dry.  Neurological:     General: No focal deficit present.     Mental Status: She is alert. Mental status is at baseline.  Psychiatric:        Mood and Affect: Mood normal.        Behavior: Behavior normal.     ED Results / Procedures / Treatments   Labs (all labs ordered are listed, but only abnormal results are displayed) Labs Reviewed  COMPREHENSIVE METABOLIC PANEL - Abnormal; Notable for the following components:      Result Value   BUN 24 (*)    Calcium 8.6 (*)    All other components within normal limits  CBC WITH  DIFFERENTIAL/PLATELET  LIPASE, BLOOD  TROPONIN I (HIGH SENSITIVITY)    EKG EKG Interpretation  Date/Time:  Monday November 24 2022 13:17:01 EST Ventricular Rate:  56 PR Interval:  161 QRS Duration: 93 QT Interval:  454 QTC Calculation: 439 R Axis:   -11 Text Interpretation: Sinus rhythm Low voltage, precordial leads Abnormal R-wave progression, early transition Confirmed by Octaviano Glow [id number]) on 11/24/2022 1:20:02 PM  Radiology DG Chest 2 View  Result Date: 11/24/2022 CLINICAL DATA:  Chest discomfort. EXAM: CHEST - 2 VIEW COMPARISON:  Chest x-ray dated 01/12/2020 FINDINGS: Heart size and mediastinal contours are within normal limits. Lungs are clear. No pleural effusion or pneumothorax is seen. Degenerative spondylosis of the thoracic spine, mild to moderate in degree. No acute-appearing osseous abnormality. IMPRESSION: No active cardiopulmonary disease. No evidence of pneumonia or pulmonary edema. Electronically Signed   By: Franki Cabot M.D.   On: 11/24/2022 12:58    Procedures Procedures    Medications Ordered in ED Medications  alum & mag hydroxide-simeth (MAALOX/MYLANTA) 200-200-20 MG/5ML suspension 30 mL (30 mLs Oral Given 11/24/22 1314)  famotidine (PEPCID) IVPB 20 mg premix (0 mg Intravenous Stopped 11/24/22 1452)    ED Course/ Medical Decision Making/ A&P Clinical Course as of 11/24/22 1454  Mon Nov 24, 2022  1450 Patient reports improvement of her chest discomfort and is very much wanting to leave at this point.  I think her history is consistent enough with reflux or esophagitis that it is reasonable for discharge, and I advised that she call her GI doctor to schedule follow-up. [MT]    Clinical Course User Index [MT] Armenta Erskin, Carola Rhine, MD                             Medical Decision Making Amount and/or Complexity of Data Reviewed Labs: ordered. Radiology: ordered. ECG/medicine tests: ordered.  Risk OTC drugs. Prescription drug management.   This  patient presents to the ED with concern for epigastric and chest discomfort. This involves an extensive number of treatment options, and is a complaint that carries with it a high risk of complications and morbidity.  The differential diagnosis includes esophagitis vs gastritis versus reflux versus pneumonia versus coronary disease versus other  Co-morbidities that complicate the patient evaluation: History of esophageal disease, esophageal stricture, hiatal hernia, at high risk of esophageal complication  Additional history obtained from patient's husband at bedside  I ordered and personally interpreted labs.  The pertinent results include: No findings on labs.  Initial troponin is negative, with multiple days of symptoms, lowering suspicion for cardiac ischemia.  I ordered imaging studies including xray chest I independently visualized and interpreted imaging which showed no  acute abnormal findings I agree with the radiologist interpretation  The patient was maintained on a cardiac monitor.  I personally viewed and interpreted the cardiac monitored which showed an underlying rhythm of: Sinus rhythm  Per my interpretation the patient's ECG shows normal sinus rhythm no acute ischemic findings  I ordered medication including GI medications for suspected esophagitis or gastritis  I have reviewed the patients home medicines and have made adjustments as needed  Test Considered: Lower suspicion for acute PE clinically denies any indication for CT imaging or CT angiogram at this time.  Low suspicion for aortic dissection clinically.  After the interventions noted above, I reevaluated the patient and found that they have: improved   Dispostion:  After consideration of the diagnostic results and the patients response to treatment, I feel that the patent would benefit from close outpatient follow-up         Final Clinical Impression(s) / ED Diagnoses Final diagnoses:  Substernal chest  pain    Rx / DC Orders ED Discharge Orders     None         Wyvonnia Dusky, MD 11/24/22 1454

## 2022-11-24 NOTE — ED Triage Notes (Signed)
Pt c/o midsternal non radiating CP described as burning that started last night. Pt states pain worsened this morning when she woke up. Pt denies cardiac hx.

## 2022-11-24 NOTE — Discharge Instructions (Addendum)
Please call your gastroenterologist to discuss the pain that you are having.  This may be related to spasms or problems with your esophagus or your hiatal hernia.  In the meantime, stick to a liquid and soft diet, avoiding heavy foods for the next 2 to 3 days.  You can consider using over-the-counter antacid medications, such as Maalox, Pepcid, or Tums, in addition to your omeprazole.  Do not eat 3 hours before bedtime, and consider sleeping up on 2 pillows at night.  We do not see signs of any serious heart or lung disease on your test in the ER today.  However, a single normal workup in the ER does not rule out all potentially serious disease.  I strongly recommend you schedule follow-up appointment with your primary care office as soon as possible.  If your pain is worsening, repeating to feel lightheaded, loss of consciousness, persistent vomiting, or of any other emergency concerns, please return to the ER immediately.

## 2024-04-16 ENCOUNTER — Encounter (HOSPITAL_COMMUNITY): Payer: Self-pay | Admitting: Interventional Radiology

## 2024-05-25 ENCOUNTER — Other Ambulatory Visit: Payer: Self-pay | Admitting: Internal Medicine

## 2024-05-25 DIAGNOSIS — R0782 Intercostal pain: Secondary | ICD-10-CM

## 2024-05-25 DIAGNOSIS — R7989 Other specified abnormal findings of blood chemistry: Secondary | ICD-10-CM

## 2024-05-25 DIAGNOSIS — R7402 Elevation of levels of lactic acid dehydrogenase (LDH): Secondary | ICD-10-CM

## 2024-05-26 ENCOUNTER — Other Ambulatory Visit: Payer: Self-pay

## 2024-05-26 ENCOUNTER — Encounter (HOSPITAL_COMMUNITY): Payer: Self-pay

## 2024-05-26 ENCOUNTER — Emergency Department (HOSPITAL_COMMUNITY)

## 2024-05-26 ENCOUNTER — Emergency Department (HOSPITAL_COMMUNITY)
Admission: EM | Admit: 2024-05-26 | Discharge: 2024-05-26 | Disposition: A | Discharge status: Home / Self Care | Source: Ambulatory Visit | Attending: Emergency Medicine | Admitting: Emergency Medicine

## 2024-05-26 DIAGNOSIS — I10 Essential (primary) hypertension: Secondary | ICD-10-CM | POA: Insufficient documentation

## 2024-05-26 DIAGNOSIS — M62838 Other muscle spasm: Secondary | ICD-10-CM | POA: Diagnosis not present

## 2024-05-26 DIAGNOSIS — Z79899 Other long term (current) drug therapy: Secondary | ICD-10-CM | POA: Diagnosis not present

## 2024-05-26 DIAGNOSIS — Z7982 Long term (current) use of aspirin: Secondary | ICD-10-CM | POA: Insufficient documentation

## 2024-05-26 DIAGNOSIS — J45909 Unspecified asthma, uncomplicated: Secondary | ICD-10-CM | POA: Diagnosis not present

## 2024-05-26 DIAGNOSIS — R109 Unspecified abdominal pain: Secondary | ICD-10-CM | POA: Diagnosis present

## 2024-05-26 DIAGNOSIS — R0781 Pleurodynia: Secondary | ICD-10-CM | POA: Diagnosis not present

## 2024-05-26 HISTORY — DX: Neoplasm of unspecified behavior of brain: D49.6

## 2024-05-26 LAB — CBC
HCT: 42.3 % (ref 36.0–46.0)
Hemoglobin: 14.3 g/dL (ref 12.0–15.0)
MCH: 31.6 pg (ref 26.0–34.0)
MCHC: 33.8 g/dL (ref 30.0–36.0)
MCV: 93.4 fL (ref 80.0–100.0)
Platelets: 202 K/uL (ref 150–400)
RBC: 4.53 MIL/uL (ref 3.87–5.11)
RDW: 12.7 % (ref 11.5–15.5)
WBC: 5.2 K/uL (ref 4.0–10.5)
nRBC: 0 % (ref 0.0–0.2)

## 2024-05-26 LAB — HEPATIC FUNCTION PANEL
ALT: 17 U/L (ref 0–44)
AST: 25 U/L (ref 15–41)
Albumin: 3.8 g/dL (ref 3.5–5.0)
Alkaline Phosphatase: 73 U/L (ref 38–126)
Bilirubin, Direct: 0.2 mg/dL (ref 0.0–0.2)
Indirect Bilirubin: 0.8 mg/dL (ref 0.3–0.9)
Total Bilirubin: 1 mg/dL (ref 0.0–1.2)
Total Protein: 7.5 g/dL (ref 6.5–8.1)

## 2024-05-26 LAB — BASIC METABOLIC PANEL WITH GFR
Anion gap: 11 (ref 5–15)
BUN: 18 mg/dL (ref 8–23)
CO2: 26 mmol/L (ref 22–32)
Calcium: 9.6 mg/dL (ref 8.9–10.3)
Chloride: 102 mmol/L (ref 98–111)
Creatinine, Ser: 0.98 mg/dL (ref 0.44–1.00)
GFR, Estimated: >60 mL/min (ref >60)
Glucose, Bld: 106 mg/dL — ABNORMAL HIGH (ref 70–99)
Potassium: 4 mmol/L (ref 3.5–5.1)
Sodium: 139 mmol/L (ref 135–145)

## 2024-05-26 LAB — LIPASE, BLOOD: Lipase: 26 U/L (ref 11–51)

## 2024-05-26 LAB — TROPONIN I (HIGH SENSITIVITY): Troponin I (High Sensitivity): 9 ng/L (ref <18)

## 2024-05-26 MED ORDER — LIDOCAINE 5 % EX PTCH
1.0000 | MEDICATED_PATCH | CUTANEOUS | 0 refills | Status: AC
Start: 2024-05-26 — End: ?

## 2024-05-26 MED ORDER — FENTANYL CITRATE PF 50 MCG/ML IJ SOSY
50.0000 ug | PREFILLED_SYRINGE | Freq: Once | INTRAMUSCULAR | Status: AC
  Administered 2024-05-26: 50 ug via INTRAVENOUS
  Filled 2024-05-26: qty 1

## 2024-05-26 MED ORDER — CYCLOBENZAPRINE HCL 10 MG PO TABS: 10.0000 mg | ORAL_TABLET | Freq: Two times a day (BID) | ORAL | 0 refills | Status: AC | PRN

## 2024-05-26 MED ORDER — IOHEXOL 350 MG/ML SOLN
75.0000 mL | Freq: Once | INTRAVENOUS | Status: AC | PRN
  Administered 2024-05-26: 100 mL via INTRAVENOUS

## 2024-05-26 MED ORDER — CYCLOBENZAPRINE HCL 10 MG PO TABS
10.0000 mg | ORAL_TABLET | Freq: Once | ORAL | Status: DC
  Filled 2024-05-26: qty 1

## 2024-05-26 MED ORDER — LIDOCAINE 5 % EX PTCH
1.0000 | MEDICATED_PATCH | CUTANEOUS | Status: DC
  Filled 2024-05-26: qty 1

## 2024-05-26 NOTE — ED Provider Notes (Signed)
 [place] EMERGENCY DEPARTMENT AT [facility] Provider Note   CSN: [id number] Arrival date & time: 05/26/24  1049     Patient presents with: Flank Pain   Cassandra Kelley is a 68 y.o. female.   The history is provided by the patient and medical records. No language interpreter was used.  Flank Pain This is a new problem. The current episode started more than 1 week ago. The problem occurs constantly. The problem has been gradually worsening. Associated symptoms include chest pain (L side and back) and abdominal pain (L side and back). Pertinent negatives include no headaches and no shortness of breath (hurts to take deep breath). Nothing aggravates the symptoms. Nothing relieves the symptoms. She has tried nothing for the symptoms. The treatment provided no relief.       Prior to Admission medications   Medication Sig Start Date End Date Taking? Authorizing Provider  aspirin 81 MG tablet Take 81 mg by mouth daily.    [provider]  Aspirin-Salicylamide-Caffeine (BC HEADACHE PO) Take 1 Package by mouth as needed (headaches).    [provider]  aspirin-sod bicarb-citric acid (ALKA-SELTZER) 325 MG TBEF tablet Take 325 mg by mouth every 6 (six) hours as needed (prn nausea).    [provider]  Calcium Carb-Cholecalciferol (CALCIUM PLUS VITAMIN D3 PO) Take 1 tablet by mouth 2 (two) times daily.     [provider]  cetirizine (ZYRTEC) 10 MG tablet Take 10 mg by mouth daily.    [provider]  esomeprazole (NEXIUM) 40 MG capsule  09/17/20   [provider]  gabapentin (NEURONTIN) 300 MG capsule Take 300 mg by mouth at bedtime.     [provider]  Multiple Vitamins-Minerals (MULTIVITAL PO) Take 1 tablet by mouth daily. Once a day     [provider]  omeprazole (PRILOSEC) 40 MG capsule Take 40 mg by mouth daily.    [provider]  oxyCODONE  (ROXICODONE ) 15 MG immediate release tablet Take 15 mg  by mouth 4 (four) times daily as needed. 02/03/20   [provider]  polyethylene glycol (MIRALAX / GLYCOLAX) 17 g packet Take 17 g by mouth daily as needed for moderate constipation.     [provider]  pravastatin (PRAVACHOL) 40 MG tablet Take 40 mg by mouth daily.    [provider]  Specialty Vitamins Products (MENOPAUSE SUPPORT PO) Take 1 tablet by mouth daily.    [provider]  telmisartan-hydrochlorothiazide (MICARDIS HCT) 80-12.5 MG tablet Take 0.5 tablets by mouth daily.    [provider]    Allergies: Patient has no known allergies.    Review of Systems  Constitutional:  Positive for fatigue. Negative for chills and fever.  HENT:  Negative for congestion.   Eyes:  Negative for visual disturbance.  Respiratory:  Negative for cough, chest tightness, shortness of breath (hurts to take deep breath) and wheezing.   Cardiovascular:  Positive for chest pain (L side and back). Negative for palpitations and leg swelling.  Gastrointestinal:  Positive for abdominal pain (L side and back). Negative for constipation, diarrhea, nausea and vomiting.  Genitourinary:  Positive for flank pain. Negative for dysuria.  Musculoskeletal:  Positive for back pain. Negative for neck pain and neck stiffness.  Skin:  Negative for rash and wound.  Neurological:  Negative for weakness, light-headedness, numbness and headaches.  Psychiatric/Behavioral:  Negative for agitation.   All other systems reviewed and are negative.   Updated Vital Signs BP  124/82   Pulse 66   Temp 98.3 F (36.8 C) (Oral)   Resp 19   Ht 4' 11 (1.499 m)   Wt 93 kg   SpO2 100%   BMI 41.40 kg/m   Physical Exam Vitals and nursing note reviewed.  Constitutional:      General: She is not in acute distress.    Appearance: She is well-developed. She is not ill-appearing, toxic-appearing or diaphoretic.  HENT:     Head: Normocephalic and atraumatic.     Nose: No congestion or  rhinorrhea.     Mouth/Throat:     Pharynx: No oropharyngeal exudate or posterior oropharyngeal erythema.  Eyes:     Extraocular Movements: Extraocular movements intact.     Conjunctiva/sclera: Conjunctivae normal.     Pupils: Pupils are equal, round, and reactive to light.  Cardiovascular:     Rate and Rhythm: Normal rate and regular rhythm.     Heart sounds: No murmur heard. Pulmonary:     Effort: Pulmonary effort is normal. No respiratory distress.     Breath sounds: Normal breath sounds. No wheezing, rhonchi or rales.  Chest:     Chest wall: Tenderness (L lateral) present.  Abdominal:     General: Abdomen is flat.     Palpations: Abdomen is soft.     Tenderness: There is no abdominal tenderness. There is left CVA tenderness. There is no guarding or rebound.  Musculoskeletal:        General: Tenderness present. No swelling.     Cervical back: Neck supple.     Right lower leg: No edema.     Left lower leg: No edema.  Skin:    General: Skin is warm and dry.     Capillary Refill: Capillary refill takes less than 2 seconds.     Findings: No erythema or rash.  Neurological:     General: No focal deficit present.     Mental Status: She is alert.     Sensory: No sensory deficit.     Motor: No weakness.  Psychiatric:        Mood and Affect: Mood normal.     (all labs ordered are listed, but only abnormal results are displayed) Labs Reviewed  BASIC METABOLIC PANEL WITH GFR - Abnormal; Notable for the following components:      Result Value   Glucose, Bld 106 (*)    All other components within normal limits  CBC  LIPASE, BLOOD  HEPATIC FUNCTION PANEL  TROPONIN I (HIGH SENSITIVITY)  TROPONIN I (HIGH SENSITIVITY)    EKG: EKG Interpretation Date/Time:  Thursday May 26 2024 12:42:50 EDT Ventricular Rate:  51 PR Interval:  165 QRS Duration:  97 QT Interval:  466 QTC Calculation: 430 R Axis:   -13  Text Interpretation: Sinus rhythm Low voltage, precordial leads  Abnormal R-wave progression, early transition whewn compared to prior, slower rate NO STEMI Confirmed by Ginger Barefoot ([id number]) on 05/26/2024 12:49:49 PM  Radiology: CT Angio Chest PE W and/or Wo Contrast Result Date: 05/26/2024 CLINICAL DATA:  Pulmonary embolism (PE) suspected, high prob Pleuritic pain, cancer history, reportedly had a positive D-dimer this week. Pleuritic pain in left side and back with an CVA area.; flank pain. * Tracking Code: BO * EXAM: CT ANGIOGRAPHY CHEST CT ABDOMEN AND PELVIS WITH CONTRAST TECHNIQUE: Multidetector CT imaging of the chest was performed using the standard protocol during bolus administration of intravenous contrast. Multiplanar CT image reconstructions and MIPs were obtained to evaluate the vascular anatomy.  Multidetector CT imaging of the abdomen and pelvis was performed using the standard protocol during bolus administration of intravenous contrast. RADIATION DOSE REDUCTION: This exam was performed according to the departmental dose-optimization program which includes automated exposure control, adjustment of the mA and/or kV according to patient size and/or use of iterative reconstruction technique. CONTRAST:  OMNIPAQUE  IOHEXOL  350 MG/ML SOLN COMPARISON:  CT scan abdomen and pelvis from 01/12/2020. FINDINGS: CTA CHEST FINDINGS Cardiovascular: No evidence of embolism to the proximal subsegmental pulmonary artery level. Mild cardiomegaly. No pericardial effusion. No aortic aneurysm. Mediastinum/Nodes: Visualized thyroid gland appears grossly unremarkable. No solid / cystic mediastinal masses. The esophagus is nondistended precluding optimal assessment. There is mild circumferential thickening of the esophagus, which is most likely seen in the settings of chronic gastroesophageal reflux disease versus esophagitis. No axillary, mediastinal or hilar lymphadenopathy by size criteria. Lungs/Pleura: The central tracheo-bronchial tree is patent. There is mild, smooth,  circumferential thickening of the segmental and subsegmental bronchial walls, throughout bilateral lungs, which is nonspecific. Findings are most commonly seen with bronchitis or reactive airway disease, such as asthma. No mass or consolidation. No pleural effusion or pneumothorax. No suspicious lung nodules. Musculoskeletal: The visualized soft tissues of the chest wall are grossly unremarkable. No suspicious osseous lesions. There are mild multilevel degenerative changes in the visualized spine. Review of the MIP images confirms the above findings. CT ABDOMEN and PELVIS FINDINGS Hepatobiliary: The liver is normal in size. Non-cirrhotic configuration. No suspicious mass. No intrahepatic bile duct dilation. There is mild prominence of the extrahepatic bile duct, most likely due to post cholecystectomy status. Gallbladder is surgically absent. Pancreas: Unremarkable. No pancreatic ductal dilatation or surrounding inflammatory changes. Spleen: Normal in size. There is linear calcification along the lateral aspect of the capsule, nonspecific but commonly seen as a sequela of prior hemorrhage. No focal lesion. Adrenals/Urinary Tract: Adrenal glands are unremarkable. No suspicious renal mass. Mild persistent bilateral fetal lobulations noted. There is a 10 x 11 mm simple cyst arising from the right kidney interpolar region, posterolaterally. No nephroureterolithiasis or obstructive uropathy on either side. Unremarkable urinary bladder. Stomach/Bowel: There is a small sliding hiatal hernia. No disproportionate dilation of the small or large bowel loops. No evidence of abnormal bowel wall thickening or inflammatory changes. The appendix was not visualized; however there is no acute inflammatory process in the right lower quadrant. Vascular/Lymphatic: No ascites or pneumoperitoneum. No abdominal or pelvic lymphadenopathy, by size criteria. No aneurysmal dilation of the major abdominal arteries. Reproductive: The uterus is  surgically absent. No large adnexal mass. Other: There is a small midline epigastric fat containing ventral hernia. There is an additional small to moderate midline lower anterior abdominal wall fat containing ventral hernia. The soft tissues and abdominal wall are otherwise unremarkable. Musculoskeletal: No suspicious osseous lesions. There are moderate multilevel degenerative changes in the visualized spine. There is mild-to-moderate levoscoliosis of the lumbar spine with apex at L3 vertebral body level. Review of the MIP images confirms the above findings. IMPRESSION: 1. No embolism to the proximal subsegmental pulmonary artery level. 2. No lung mass, consolidation, pleural effusion or pneumothorax. 3. No acute inflammatory process identified within the abdomen or pelvis. 4. Multiple other nonacute observations, as described above. Electronically Signed   By: Ree Molt M.D.   On: 05/26/2024 14:08   CT ABDOMEN PELVIS W CONTRAST Result Date: 05/26/2024 CLINICAL DATA:  Pulmonary embolism (PE) suspected, high prob Pleuritic pain, cancer history, reportedly had a positive D-dimer this week. Pleuritic pain in left side  and back with an CVA area.; flank pain. * Tracking Code: BO * EXAM: CT ANGIOGRAPHY CHEST CT ABDOMEN AND PELVIS WITH CONTRAST TECHNIQUE: Multidetector CT imaging of the chest was performed using the standard protocol during bolus administration of intravenous contrast. Multiplanar CT image reconstructions and MIPs were obtained to evaluate the vascular anatomy. Multidetector CT imaging of the abdomen and pelvis was performed using the standard protocol during bolus administration of intravenous contrast. RADIATION DOSE REDUCTION: This exam was performed according to the departmental dose-optimization program which includes automated exposure control, adjustment of the mA and/or kV according to patient size and/or use of iterative reconstruction technique. CONTRAST:  OMNIPAQUE  IOHEXOL  350  MG/ML SOLN COMPARISON:  CT scan abdomen and pelvis from 01/12/2020. FINDINGS: CTA CHEST FINDINGS Cardiovascular: No evidence of embolism to the proximal subsegmental pulmonary artery level. Mild cardiomegaly. No pericardial effusion. No aortic aneurysm. Mediastinum/Nodes: Visualized thyroid gland appears grossly unremarkable. No solid / cystic mediastinal masses. The esophagus is nondistended precluding optimal assessment. There is mild circumferential thickening of the esophagus, which is most likely seen in the settings of chronic gastroesophageal reflux disease versus esophagitis. No axillary, mediastinal or hilar lymphadenopathy by size criteria. Lungs/Pleura: The central tracheo-bronchial tree is patent. There is mild, smooth, circumferential thickening of the segmental and subsegmental bronchial walls, throughout bilateral lungs, which is nonspecific. Findings are most commonly seen with bronchitis or reactive airway disease, such as asthma. No mass or consolidation. No pleural effusion or pneumothorax. No suspicious lung nodules. Musculoskeletal: The visualized soft tissues of the chest wall are grossly unremarkable. No suspicious osseous lesions. There are mild multilevel degenerative changes in the visualized spine. Review of the MIP images confirms the above findings. CT ABDOMEN and PELVIS FINDINGS Hepatobiliary: The liver is normal in size. Non-cirrhotic configuration. No suspicious mass. No intrahepatic bile duct dilation. There is mild prominence of the extrahepatic bile duct, most likely due to post cholecystectomy status. Gallbladder is surgically absent. Pancreas: Unremarkable. No pancreatic ductal dilatation or surrounding inflammatory changes. Spleen: Normal in size. There is linear calcification along the lateral aspect of the capsule, nonspecific but commonly seen as a sequela of prior hemorrhage. No focal lesion. Adrenals/Urinary Tract: Adrenal glands are unremarkable. No suspicious renal mass.  Mild persistent bilateral fetal lobulations noted. There is a 10 x 11 mm simple cyst arising from the right kidney interpolar region, posterolaterally. No nephroureterolithiasis or obstructive uropathy on either side. Unremarkable urinary bladder. Stomach/Bowel: There is a small sliding hiatal hernia. No disproportionate dilation of the small or large bowel loops. No evidence of abnormal bowel wall thickening or inflammatory changes. The appendix was not visualized; however there is no acute inflammatory process in the right lower quadrant. Vascular/Lymphatic: No ascites or pneumoperitoneum. No abdominal or pelvic lymphadenopathy, by size criteria. No aneurysmal dilation of the major abdominal arteries. Reproductive: The uterus is surgically absent. No large adnexal mass. Other: There is a small midline epigastric fat containing ventral hernia. There is an additional small to moderate midline lower anterior abdominal wall fat containing ventral hernia. The soft tissues and abdominal wall are otherwise unremarkable. Musculoskeletal: No suspicious osseous lesions. There are moderate multilevel degenerative changes in the visualized spine. There is mild-to-moderate levoscoliosis of the lumbar spine with apex at L3 vertebral body level. Review of the MIP images confirms the above findings. IMPRESSION: 1. No embolism to the proximal subsegmental pulmonary artery level. 2. No lung mass, consolidation, pleural effusion or pneumothorax. 3. No acute inflammatory process identified within the abdomen or pelvis. 4. Multiple  other nonacute observations, as described above. Electronically Signed   By: Ree Molt M.D.   On: 05/26/2024 14:08     Procedures   Medications Ordered in the ED  cyclobenzaprine  (FLEXERIL ) tablet 10 mg (has no administration in time range)  lidocaine  (LIDODERM ) 5 % 1 patch (has no administration in time range)  fentaNYL  (SUBLIMAZE ) injection 50 mcg (50 mcg Intravenous Given 05/26/24 1239)   iohexol  (OMNIPAQUE ) 350 MG/ML injection 75 mL (100 mLs Intravenous Contrast Given 05/26/24 1331)                                    Medical Decision Making Amount and/or Complexity of Data Reviewed Labs: ordered. Radiology: ordered.  Risk Prescription drug management.    YOBANA CULLITON is a 68 y.o. female with a past medical history significant for hypertension, hypercholesterolemia, asthma, previous brain tumor, sleep apnea, previous cholecystectomy and previous hysterectomy who presents for further evaluation of pleuritic left lateral chest and back pain and flank pain in the setting of a positive D-dimer.  According to patient, for about 2 months she has had some pain in her left back and left lateral chest and left side of her abdomen.  She reports it acutely worsened her last 2 weeks and she saw her doctor who ordered a D-dimer that was reportedly positive.  She said they ordered a CT scan but no one told her where to go so she did not get it done but the pain has worsened over the last day or 2 and she presents for evaluation.  She reports the pain is severe and it hurts to take a deep breath.  She denies any anterior abdominal pain or nausea or vomiting.  She denies any constipation or diarrhea.  No urinary symptoms.  Denies leg pain or leg swelling.  Denies any fevers, chills or productive cough.  On exam, patient does have tenderness to her left back and left side of the chest.  She also tenderness to her left flank and left CVA area.  Midline was nontender.  I do not see rash to suggest shingles.  Intact sensation strength and pulses on my exam.  Pupil symmetric and reactive.  She did have some dry mucous membranes so we will give a small amount of fluids.  Clinically I am concerned with this report of previous cancer, pleuritic torso pain and positive D-dimer.  Will order a CT PE study and CT ab pelvis with contrast to further evaluate.  Also get urinalysis given the pain in the left  CVA area and flank.  We will give her some pain medicine and some fluids.  Will get other labs.  Anticipate reassessment after workup to determine disposition.  2:44 PM CT imaging returned overall reassuring.  No evidence of pulm embolism or other surgical abdomen or pelvis problem.  Labs returned overall reassuring as well.  She does not want a wait for urinalysis as she now says she has no urinary symptoms.  She also says that she had a urinalysis that was reassuring.  She says that she takes Robaxin occasionally that helps with pain but she would like to try different muscle relaxant.  We will give her Flexeril  and Lidoderm  patches as I suspect this is more in the wall of her torso.  She agrees with plan of care and will discharge and follow-up with her PCP.  Patient we discharged for outpatient follow-up.  Final diagnoses:  Left flank pain  Pleuritic pain  Muscle spasm    ED Discharge Orders          Ordered    cyclobenzaprine  (FLEXERIL ) 10 MG tablet  2 times daily PRN        05/26/24 1445    lidocaine  (LIDODERM ) 5 %  Every 24 hours        05/26/24 1445            Clinical Impression: 1. Left flank pain   2. Pleuritic pain   3. Muscle spasm     Disposition: Discharge  Condition: Good  I have discussed the results, Dx and Tx plan with the pt(& family if present). He/she/they expressed understanding and agree(s) with the plan. Discharge instructions discussed at great length. Strict return precautions discussed and pt &/or family have verbalized understanding of the instructions. No further questions at time of discharge.    New Prescriptions   CYCLOBENZAPRINE  (FLEXERIL ) 10 MG TABLET    Take 1 tablet (10 mg total) by mouth 2 (two) times daily as needed for muscle spasms.   LIDOCAINE  (LIDODERM ) 5 %    Place 1 patch onto the skin daily. Remove & Discard patch within 12 hours or as directed by MD    Follow Up: Valma Carwin, MD [id number] [place]  [id number] [phone]        Rafael Quesada, Lonni PARAS, MD 05/26/24 1450

## 2024-05-26 NOTE — Discharge Instructions (Signed)
 Your history, exam, workup today did not reveal clear evidence of an acute surgical problem or other finding that would require admission at this time.  The CT scan did not show any evidence of pulmonary embolism or other lung abnormality nor did show evidence of surgical problem in the abdomen pelvis.  Your labs are overall reassuring.  Given your lack of urinary symptoms we agreed to hold on urinalysis as you report you recently had a urine with your PCP.  As you have tolerated muscle relaxants in the past, we will switch you to a different muscle relaxant and try Lidoderm  patches as you did have muscle spasm and focal tenderness on my exam.  We did not see evidence of rash to suggest shingles.  Please follow-up with your primary doctor and rest and stay hydrated.  If any symptoms change or worsen acutely, please return to the nearest emergency department.

## 2024-05-26 NOTE — ED Triage Notes (Signed)
 Patient has left sided flank pain for 2 months. Stated it hurts to take a deep breath. Stated she goes to pain management but this feels like a different kind of pain. Had a pain shot from her doctor and said it took the edge off. Patient had a d dimer blood test that was positive on Friday.
